# Patient Record
Sex: Female | Born: 1960 | Race: Black or African American | Hispanic: No | Smoking: Current every day smoker
Health system: Southern US, Community
[De-identification: ages and names within clinical notes are randomized; demographics above are authoritative.]

## PROBLEM LIST (undated history)

## (undated) DIAGNOSIS — E611 Iron deficiency: Secondary | ICD-10-CM

## (undated) DIAGNOSIS — E538 Deficiency of other specified B group vitamins: Secondary | ICD-10-CM

## (undated) DIAGNOSIS — F32A Depression, unspecified: Secondary | ICD-10-CM

## (undated) DIAGNOSIS — I10 Essential (primary) hypertension: Secondary | ICD-10-CM

## (undated) DIAGNOSIS — R7303 Prediabetes: Secondary | ICD-10-CM

## (undated) DIAGNOSIS — I480 Paroxysmal atrial fibrillation: Secondary | ICD-10-CM

## (undated) DIAGNOSIS — M1990 Unspecified osteoarthritis, unspecified site: Secondary | ICD-10-CM

## (undated) DIAGNOSIS — I5030 Unspecified diastolic (congestive) heart failure: Secondary | ICD-10-CM

## (undated) DIAGNOSIS — G4733 Obstructive sleep apnea (adult) (pediatric): Secondary | ICD-10-CM

## (undated) DIAGNOSIS — I4892 Unspecified atrial flutter: Secondary | ICD-10-CM

## (undated) DIAGNOSIS — J449 Chronic obstructive pulmonary disease, unspecified: Secondary | ICD-10-CM

## (undated) DIAGNOSIS — E039 Hypothyroidism, unspecified: Secondary | ICD-10-CM

## (undated) DIAGNOSIS — F329 Major depressive disorder, single episode, unspecified: Secondary | ICD-10-CM

## (undated) HISTORY — DX: Deficiency of other specified B group vitamins: E53.8

## (undated) HISTORY — DX: Hypothyroidism, unspecified: E03.9

## (undated) HISTORY — DX: Depression, unspecified: F32.A

## (undated) HISTORY — DX: Obstructive sleep apnea (adult) (pediatric): G47.33

## (undated) HISTORY — DX: Unspecified atrial flutter: I48.92

## (undated) HISTORY — DX: Iron deficiency: E61.1

---
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
## 2000-10-19 HISTORY — PX: BARIATRIC SURGERY: SHX1103

## 2012-10-24 DIAGNOSIS — R519 Headache, unspecified: Secondary | ICD-10-CM | POA: Insufficient documentation

## 2012-10-24 DIAGNOSIS — E039 Hypothyroidism, unspecified: Secondary | ICD-10-CM

## 2012-10-24 DIAGNOSIS — G8929 Other chronic pain: Secondary | ICD-10-CM | POA: Insufficient documentation

## 2012-10-24 DIAGNOSIS — M1990 Unspecified osteoarthritis, unspecified site: Secondary | ICD-10-CM | POA: Insufficient documentation

## 2012-10-24 DIAGNOSIS — Z6841 Body Mass Index (BMI) 40.0 and over, adult: Secondary | ICD-10-CM | POA: Insufficient documentation

## 2017-01-05 DIAGNOSIS — H04129 Dry eye syndrome of unspecified lacrimal gland: Secondary | ICD-10-CM | POA: Insufficient documentation

## 2017-01-05 DIAGNOSIS — H25813 Combined forms of age-related cataract, bilateral: Secondary | ICD-10-CM | POA: Insufficient documentation

## 2019-10-20 DIAGNOSIS — R7303 Prediabetes: Secondary | ICD-10-CM | POA: Insufficient documentation

## 2019-10-20 DIAGNOSIS — F419 Anxiety disorder, unspecified: Secondary | ICD-10-CM | POA: Insufficient documentation

## 2019-11-17 ENCOUNTER — Ambulatory Visit: Payer: Self-pay | Admitting: Cardiology

## 2019-11-27 ENCOUNTER — Ambulatory Visit (INDEPENDENT_AMBULATORY_CARE_PROVIDER_SITE_OTHER): Payer: Medicare Other

## 2019-11-27 ENCOUNTER — Ambulatory Visit: Payer: Medicare Other | Admitting: Cardiology

## 2019-11-27 ENCOUNTER — Other Ambulatory Visit: Payer: Self-pay

## 2019-11-27 ENCOUNTER — Encounter: Payer: Self-pay | Admitting: Cardiology

## 2019-11-27 VITALS — BP 119/86 | HR 91 | Ht 62.0 in | Wt >= 6400 oz

## 2019-11-27 DIAGNOSIS — Z8679 Personal history of other diseases of the circulatory system: Secondary | ICD-10-CM | POA: Diagnosis not present

## 2019-11-27 DIAGNOSIS — F17200 Nicotine dependence, unspecified, uncomplicated: Secondary | ICD-10-CM | POA: Diagnosis not present

## 2019-11-27 DIAGNOSIS — I10 Essential (primary) hypertension: Secondary | ICD-10-CM

## 2019-11-27 NOTE — Patient Instructions (Signed)
Medication Instructions:  Your physician recommends that you continue on your current medications as directed. Please refer to the Current Medication list given to you today.  *If you need a refill on your cardiac medications before your next appointment, please call your pharmacy*  Lab Work: None ordered If you have labs (blood work) drawn today and your tests are completely normal, you will receive your results only by: . MyChart Message (if you have MyChart) OR . A paper copy in the mail If you have any lab test that is abnormal or we need to change your treatment, we will call you to review the results.  Testing/Procedures: Your physician has recommended that you wear an zio monitor. Zio monitors are medical devices that record the heart's electrical activity. Doctors most often us these monitors to diagnose arrhythmias. Arrhythmias are problems with the speed or rhythm of the heartbeat. The monitor is a small, portable device. You can wear one while you do your normal daily activities. This is usually used to diagnose what is causing palpitations/syncope (passing out).    Follow-Up: At [facility], you and your health needs are our priority.  As part of our continuing mission to provide you with exceptional heart care, we have created designated Provider Care Teams.  These Care Teams include your primary Cardiologist (physician) and Advanced Practice Providers (APPs -  Physician Assistants and Nurse Practitioners) who all work together to provide you with the care you need, when you need it.  Your next appointment:   4 week(s)  The format for your next appointment:   In Person  Provider:    You may see Brian Agbor-Etang, MD or one of the following Advanced Practice Providers on your designated Care Team:    Christopher Berge, NP  Ryan Dunn, PA-C  Jacquelyn Visser, PA-C   Other Instructions Your physician has recommended that you wear a Zio monitor. This monitor is a  medical device that records the heart's electrical activity. Doctors most often use these monitors to diagnose arrhythmias. Arrhythmias are problems with the speed or rhythm of the heartbeat. The monitor is a small device applied to your chest. You can wear one while you do your normal daily activities. While wearing this monitor if you have any symptoms to push the button and record what you felt. Once you have worn this monitor for the period of time provider prescribed (Usually 14 days), you will return the monitor device in the postage paid box. Once it is returned they will download the data collected and provide us with a report which the provider will then review and we will call you with those results. Important tips:  1. Avoid showering during the first 24 hours of wearing the monitor. 2. Avoid excessive sweating to help maximize wear time. 3. Do not submerge the device, no hot tubs, and no swimming pools. 4. Keep any lotions or oils away from the patch. 5. After 24 hours you may shower with the patch on. Take brief showers with your back facing the shower head.  6. Do not remove patch once it has been placed because that will interrupt data and decrease adhesive wear time. 7. Push the button when you have any symptoms and write down what you were feeling. 8. Once you have completed wearing your monitor, remove and place into box which has postage paid and place in your outgoing mailbox.  9. If for some reason you have misplaced your box then call our office and we   can provide another box and/or mail it off for you.        

## 2019-11-27 NOTE — Progress Notes (Signed)
Cardiology Office Note:    Date:  11/27/2019   ID:  Ennis Forts, DOB 23-May-1961, MRN [id number]  PCP:  Franciso Bend, NP  Cardiologist:  Debbe Odea, MD  Electrophysiologist:  None   Referring MD: No ref. provider found   Chief Complaint  Patient presents with  . other    Est. Care pt wants to discuss blood thinner not taking one at this time.  Meds reviewed verbally with pt.    History of Present Illness:    Alexandra Black is a 59 y.o. female with a hx of atrial flutter, morbid obesity, OSA, current smoker x25+ years, hypertension who presents to establish care.  Patient used to live in [place] but recently moved to the area.  She was admitted to the hospital/[facility] in September/2020, diagnosed with hypoxic respiratory failure.  While hospitalized, she was noted to be in atrial flutter.  She was started on Cardizem and Xarelto 20 mg daily.  Echocardiogram obtained during hospitalization showed normal ejection fraction with EF 55 to 60%.  Study was difficult due to morbid obesity.  She states having appointments with pulmonology for sleep apnea and BiPAP mask fitting.  Past Medical History:  Diagnosis Date  . Atrial flutter (HCC)   . Depression   . Hypothyroidism   . Iron deficiency   . OSA (obstructive sleep apnea)   . Vitamin B12 deficiency     History reviewed. No pertinent surgical history.  Current Medications: Current Meds  Medication Sig  . Biotin 1000 MCG tablet Take 1,000 mcg by mouth daily.  . Cyanocobalamin (B-12) 5000 MCG CAPS Take by mouth daily.  Marland Kitchen diltiazem (CARDIZEM) 120 MG tablet Take 120 mg by mouth daily.  Marland Kitchen ELDERBERRY PO Take by mouth daily.  . furosemide (LASIX) 40 MG tablet Take 40 mg by mouth daily.  Marland Kitchen levothyroxine (SYNTHROID) 150 MCG tablet Take 150 mcg by mouth daily before breakfast.  . Multiple Vitamin (MULTIVITAMIN ADULT PO) Take by mouth daily.  . potassium chloride (MICRO-K) 10 MEQ CR capsule Take 10 mEq by mouth  daily.  Marland Kitchen venlafaxine XR (EFFEXOR-XR) 150 MG 24 hr capsule Take 150 mg by mouth daily with breakfast.  . VITAMIN D PO Take by mouth daily.  . vitamin E 180 MG (400 UNITS) capsule Take 400 Units by mouth daily.     Allergies:   Patient has no allergy information on record.   Social History   Socioeconomic History  . Marital status: Divorced    Spouse name: Not on file  . Number of children: Not on file  . Years of education: Not on file  . Highest education level: Not on file  Occupational History  . Not on file  Tobacco Use  . Smoking status: Never Smoker  . Smokeless tobacco: Never Used  Substance and Sexual Activity  . Alcohol use: Never  . Drug use: Never  . Sexual activity: Not on file  Other Topics Concern  . Not on file  Social History Narrative  . Not on file   Social Determinants of Health   Financial Resource Strain:   . Difficulty of Paying Living Expenses: Not on file  Food Insecurity:   . Worried About Programme researcher, broadcasting/film/video in the Last Year: Not on file  . Ran Out of Food in the Last Year: Not on file  Transportation Needs:   . Lack of Transportation (Medical): Not on file  . Lack of Transportation (Non-Medical): Not on file  Physical Activity:   .  Days of Exercise per Week: Not on file  . Minutes of Exercise per Session: Not on file  Stress:   . Feeling of Stress : Not on file  Social Connections:   . Frequency of Communication with Friends and Family: Not on file  . Frequency of Social Gatherings with Friends and Family: Not on file  . Attends Religious Services: Not on file  . Active Member of Clubs or Organizations: Not on file  . Attends Archivist Meetings: Not on file  . Marital Status: Not on file     Family History: The patient's family history includes Heart disease in her mother; Hypertension in her mother.  ROS:   Please see the history of present illness.     All other systems reviewed and are negative.  EKGs/Labs/Other  Studies Reviewed:    The following studies were reviewed today:   EKG:  EKG is  ordered today.  The ekg ordered today demonstrates normal sinus rhythm, possible left atrial enlargement, left anterior hemiblock.  Recent Labs: No results found for requested labs within last 8760 hours.  Recent Lipid Panel No results found for: CHOL, TRIG, HDL, CHOLHDL, VLDL, LDLCALC, LDLDIRECT  Physical Exam:    VS:  BP 119/86 (BP Location: Right Arm, Patient Position: Sitting, Cuff Size: Large)   Pulse 91   Ht 5\' 2"  (1.575 m)   Wt (!) 451 lb (204.6 kg)   SpO2 96%   BMI 82.49 kg/m     Wt Readings from Last 3 Encounters:  11/27/19 (!) 451 lb (204.6 kg)     GEN:  Well nourished, well developed in no acute distress, obese HEENT: Normal NECK: No JVD; No carotid bruits LYMPHATICS: No lymphadenopathy CARDIAC: RRR, no murmurs, rubs, gallops RESPIRATORY:  Clear to auscultation without rales, wheezing or rhonchi  ABDOMEN: Soft, non-tender, distended MUSCULOSKELETAL:  No edema; No deformity  SKIN: Warm and dry NEUROLOGIC:  Alert and oriented x 3 PSYCHIATRIC:  Normal affect   ASSESSMENT:    1. Hx of atrial flutter   2. Essential hypertension   3. Morbid obesity ([place])   4. Smoking    PLAN:    In order of problems listed above:  1. Patient with history of A. fib flutter.  Echocardiogram shows normal ejection fraction with EF 55 to 60%.  CHA2DS2-VASc score 2 (htn, gender).  Continue Cardizem CD 120 mg daily.  Patient advised to take Xarelto 20 mg daily.  Will place 2-week cardiac monitor to evaluate A. fib burden.  We will reach out to [facility] for rhythm strip or ECG diagnosing atrial fibrillation or flutter. 2. Blood pressure well controlled.  Continue Cardizem. 3. Patient with morbid obesity, BMI 82.  Weight loss/low calorie diet advised.  Over 10 minutes spent counseling patient.  Recommended patient sees a nutritionist for added support but she declines. 4. Long history of smoking.   Smoking cessation advised.  Over 5 minutes spent counseling patient.  Follow-up in 1 month after 2-week cardiac monitor.  Total encounter time more than 61 minutes  Greater than 50% was spent in counseling and coordination of care with the patient.  Including advising patient on smoking cessation, weight loss and calorie loss, chart review for new patient, review of records from prior facility, advised on current medical therapy.   This note was generated in part or whole with voice recognition software. Voice recognition is usually quite accurate but there are transcription errors that can and very often do occur. I apologize for  any typographical errors that were not detected and corrected.  Medication Adjustments/Labs and Tests Ordered: Current medicines are reviewed at length with the patient today.  Concerns regarding medicines are outlined above.  Orders Placed This Encounter  Procedures  . LONG TERM MONITOR (3-14 DAYS)  . EKG 12-Lead   No orders of the defined types were placed in this encounter.   Patient Instructions  Medication Instructions:  Your physician recommends that you continue on your current medications as directed. Please refer to the Current Medication list given to you today.  *If you need a refill on your cardiac medications before your next appointment, please call your pharmacy*  Lab Work: None ordered If you have labs (blood work) drawn today and your tests are completely normal, you will receive your results only by: Marland Kitchen MyChart Message (if you have MyChart) OR . A paper copy in the mail If you have any lab test that is abnormal or we need to change your treatment, we will call you to review the results.  Testing/Procedures: Your physician has recommended that you wear an zio monitor. Zio monitors are medical devices that record the heart's electrical activity. Doctors most often [place] these monitors to diagnose arrhythmias. Arrhythmias are problems with the  speed or rhythm of the heartbeat. The monitor is a small, portable device. You can wear one while you do your normal daily activities. This is usually used to diagnose what is causing palpitations/syncope (passing out).     Follow-Up: At [facility], you and your health needs are our priority.  As part of our continuing mission to provide you with exceptional heart care, we have created designated Provider Care Teams.  These Care Teams include your primary Cardiologist (physician) and Advanced Practice Providers (APPs -  Physician Assistants and Nurse Practitioners) who all work together to provide you with the care you need, when you need it.  Your next appointment:   4 week(s)  The format for your next appointment:   In Person  Provider:    You may see Debbe Odea, MD or one of the following Advanced Practice Providers on your designated Care Team:    Nicolasa Ducking, NP  Eula Listen, PA-C  Marisue Ivan, PA-C   Other Instructions Your physician has recommended that you wear a Zio monitor. This monitor is a medical device that records the heart's electrical activity. Doctors most often use these monitors to diagnose arrhythmias. Arrhythmias are problems with the speed or rhythm of the heartbeat. The monitor is a small device applied to your chest. You can wear one while you do your normal daily activities. While wearing this monitor if you have any symptoms to push the button and record what you felt. Once you have worn this monitor for the period of time provider prescribed (Usually 14 days), you will return the monitor device in the postage paid box. Once it is returned they will download the data collected and provide [place] with a report which the provider will then review and we will call you with those results. Important tips:  1. Avoid showering during the first 24 hours of wearing the monitor. 2. Avoid excessive sweating to help maximize wear time. 3. Do not submerge  the device, no hot tubs, and no swimming pools. 4. Keep any lotions or oils away from the patch. 5. After 24 hours you may shower with the patch on. Take brief showers with your back facing the shower head.  6. Do not remove patch once  it has been placed because that will interrupt data and decrease adhesive wear time. 7. Push the button when you have any symptoms and write down what you were feeling. 8. Once you have completed wearing your monitor, remove and place into box which has postage paid and place in your outgoing mailbox.  9. If for some reason you have misplaced your box then call our office and we can provide another box and/or mail it off for you.           Signed, Debbe Odea, MD  11/27/2019 4:45 PM    [facility]

## 2019-11-29 ENCOUNTER — Encounter: Payer: Self-pay | Admitting: Cardiology

## 2019-12-08 ENCOUNTER — Telehealth: Payer: Self-pay | Admitting: Cardiology

## 2019-12-08 NOTE — Telephone Encounter (Signed)
Returned the patients call. Alexandra Black. The patient is to contact [employer] customer service for assistance. The patient is to call our office if assistance is still needed.

## 2019-12-08 NOTE — Telephone Encounter (Signed)
Patient states he monitor has been flashing since she got out of the shower since Wednesday. States she feels " fine". Denies any symptoms. Please call to discuss.

## 2019-12-25 ENCOUNTER — Ambulatory Visit: Payer: Medicare Other | Admitting: Cardiology

## 2019-12-29 ENCOUNTER — Ambulatory Visit: Payer: Medicare Other | Admitting: Cardiology

## 2020-01-08 ENCOUNTER — Telehealth: Payer: Self-pay | Admitting: *Deleted

## 2020-01-08 NOTE — Telephone Encounter (Signed)
-----   Message from Debbe Odea, MD sent at 01/06/2020  9:47 AM EDT ----- Atrial fibrillation noted, about 2% burden.  Occasional sinus pauses noted between 7 AM and 7:35 AM.  Patient has history of obesity, sleep apnea likely contributing to sinus pauses.  Continue Xarelto and Cardizem as prescribed.  Keep follow-up appointments with pulmonary medicine with regards to CPAP mask.  Keep follow-up appointment with me.

## 2020-01-08 NOTE — Telephone Encounter (Signed)
Results called to pt. Pt verbalized understanding. She is taking Xarelto 20 mg daily originally prescribed by her cardiologist in [place]. Med list updated. She is currently working with her pulmonologist, Dr Meredeth Ide, and PCP to get her a new CPAP mask.  Appointment with Dr. Azucena Cecil scheduled as well.

## 2020-01-26 ENCOUNTER — Ambulatory Visit: Payer: Medicare Other | Admitting: Cardiology

## 2020-02-02 ENCOUNTER — Telehealth: Payer: Self-pay | Admitting: Obstetrics and Gynecology

## 2020-02-02 NOTE — Telephone Encounter (Signed)
I have called patient to schedule appointment, I was unable to leave a voice mail. I have scheduled pt for 4/21 @2 :30 with Dr. . Will continue to contact pt to make her aware.  Referral is attached for reference.

## 2020-02-07 ENCOUNTER — Encounter: Payer: Medicare Other | Admitting: Obstetrics and Gynecology

## 2020-02-08 ENCOUNTER — Other Ambulatory Visit: Payer: Self-pay | Admitting: Family Medicine

## 2020-02-08 DIAGNOSIS — Z1231 Encounter for screening mammogram for malignant neoplasm of breast: Secondary | ICD-10-CM

## 2020-02-28 ENCOUNTER — Other Ambulatory Visit: Payer: Self-pay

## 2020-02-28 ENCOUNTER — Ambulatory Visit (INDEPENDENT_AMBULATORY_CARE_PROVIDER_SITE_OTHER): Payer: Medicare Other | Admitting: Obstetrics and Gynecology

## 2020-02-28 ENCOUNTER — Encounter: Payer: Self-pay | Admitting: Obstetrics and Gynecology

## 2020-02-28 DIAGNOSIS — Z6841 Body Mass Index (BMI) 40.0 and over, adult: Secondary | ICD-10-CM

## 2020-02-28 DIAGNOSIS — R58 Hemorrhage, not elsewhere classified: Secondary | ICD-10-CM

## 2020-02-28 NOTE — Progress Notes (Signed)
HPI:      Alexandra Black is a 59 y.o. No obstetric history on file. who LMP was No LMP recorded.  Subjective:   She presents today giving history of postmenopausal bleeding.  She states that the bleeding only lasted 1 day and she was not sure if it came from her rectum or vagina or her bladder.  She has had no bleeding since that time.  Of significant note patient takes Xarelto.   She has been in menopause for greater than 8 years with no bleeding. She states that she had a D&C for "white spots" 2 years ago and this was performed by an oncologist.  She says she had follow-up over the phone but did not go back for her next appointment. She describes a past history of uterine fibroids but has not had any issues since menopause.    Hx: The following portions of the patient's history were reviewed and updated as appropriate:             She  has a past medical history of Atrial flutter (HCC), Depression, Hypothyroidism, Iron deficiency, OSA (obstructive sleep apnea), and Vitamin B12 deficiency. She does not have a problem list on file. She  has no past surgical history on file. Her family history includes Heart disease in her mother; Hypertension in her mother. She  reports that she has been smoking. She has never used smokeless tobacco. She reports current alcohol use of about 2.0 standard drinks of alcohol per week. She reports that she does not use drugs. She has a current medication list which includes the following prescription(s): allopurinol, biotin, b-12, diltiazem, elderberry, furosemide, levothyroxine, multiple vitamin, potassium chloride, rivaroxaban, venlafaxine xr, vitamin d, and vitamin e. She has No Known Allergies.       Review of Systems:  Review of Systems  Constitutional: Denied constitutional symptoms, night sweats, recent illness, fatigue, fever, insomnia and weight loss.  Eyes: Denied eye symptoms, eye pain, photophobia, vision change and visual disturbance.   Ears/Nose/Throat/Neck: Denied ear, nose, throat or neck symptoms, hearing loss, nasal discharge, sinus congestion and sore throat.  Cardiovascular: Denied cardiovascular symptoms, arrhythmia, chest pain/pressure, edema, exercise intolerance, orthopnea and palpitations.  Respiratory: Denied pulmonary symptoms, asthma, pleuritic pain, productive sputum, cough, dyspnea and wheezing.  Gastrointestinal: Denied, gastro-esophageal reflux, melena, nausea and vomiting.  Genitourinary: See HPI for additional information.  Musculoskeletal: Denied musculoskeletal symptoms, stiffness, swelling, muscle weakness and myalgia.  Dermatologic: Denied dermatology symptoms, rash and scar.  Neurologic: Denied neurology symptoms, dizziness, headache, neck pain and syncope.  Psychiatric: Denied psychiatric symptoms, anxiety and depression.  Endocrine: Denied endocrine symptoms including hot flashes and night sweats.   Meds:   Current Outpatient Medications on File Prior to Visit  Medication Sig Dispense Refill  . allopurinol (ZYLOPRIM) 100 MG tablet Take 100 mg by mouth daily.    . Biotin 1000 MCG tablet Take 1,000 mcg by mouth daily.    . Cyanocobalamin (B-12) 5000 MCG CAPS Take by mouth daily.    Marland Kitchen diltiazem (CARDIZEM) 120 MG tablet Take 120 mg by mouth daily.    Marland Kitchen ELDERBERRY PO Take by mouth daily.    . furosemide (LASIX) 40 MG tablet Take 40 mg by mouth daily.    Marland Kitchen levothyroxine (SYNTHROID) 150 MCG tablet Take 150 mcg by mouth daily before breakfast.    . Multiple Vitamin (MULTIVITAMIN ADULT PO) Take by mouth daily.    . potassium chloride (MICRO-K) 10 MEQ CR capsule Take 10 mEq by mouth daily.    Marland Kitchen  rivaroxaban (XARELTO) 20 MG TABS tablet Take 20 mg by mouth daily with supper.    . venlafaxine XR (EFFEXOR-XR) 150 MG 24 hr capsule Take 150 mg by mouth daily with breakfast.    . VITAMIN D PO Take by mouth daily.    . vitamin E 180 MG (400 UNITS) capsule Take 400 Units by mouth daily.     No current  facility-administered medications on file prior to visit.    Objective:     Vitals:   02/28/20 1452  BP: (!) 148/80  Pulse: 64                Assessment:    No obstetric history on file. There are no problems to display for this patient.    1. Morbid obesity with BMI of 70 and over, adult ([place])   2. Bleeding     Patient with "borderline diabetes, borderline hypertension, and borderline healthy/unhealthy".  Based on these risk factors and obesity she does have an increased risk for endometrial hyperplasia.  However her history of 1 day bleeding and no bleeding before or since is not worrisome.  This is especially true because the patient does not know if the blood actually came from the vagina.  In addition, her recent "D&C" would be an excellent source to understand if there was any evidence of hyperplasia at that time. Obviously her Xarelto could contribute to any type of bleeding.   Plan:            1.  Patient will obtain records from OB/GYN and oncology in [place] and once we have the records we can best determine our next step.  She is to contact us if she has further vaginal bleeding. Orders No orders of the defined types were placed in this encounter.   No orders of the defined types were placed in this encounter.     F/U  No follow-ups on file. I spent 31 minutes involved in the care of this patient preparing to see the patient by obtaining and reviewing her medical history (including labs, imaging tests and prior procedures), documenting clinical information in the electronic health record (EHR), counseling and coordinating care plans, writing and sending prescriptions, ordering tests or procedures and directly communicating with the patient by discussing pertinent items from her history and physical exam as well as detailing my assessment and plan as noted above so that she has an informed understanding.  All of her questions were answered.  Finis Bud,  M.D. 02/28/2020 3:24 PM

## 2020-05-11 ENCOUNTER — Other Ambulatory Visit: Payer: Self-pay

## 2020-05-11 ENCOUNTER — Inpatient Hospital Stay
Admission: EM | Admit: 2020-05-11 | Discharge: 2020-05-17 | DRG: 189 | Disposition: A | Discharge status: Skilled Nursing Facility | Payer: Medicare Other | Attending: Internal Medicine | Admitting: Internal Medicine

## 2020-05-11 ENCOUNTER — Emergency Department: Payer: Medicare Other

## 2020-05-11 ENCOUNTER — Encounter: Payer: Self-pay | Admitting: Emergency Medicine

## 2020-05-11 DIAGNOSIS — J9622 Acute and chronic respiratory failure with hypercapnia: Secondary | ICD-10-CM | POA: Diagnosis present

## 2020-05-11 DIAGNOSIS — J9621 Acute and chronic respiratory failure with hypoxia: Principal | ICD-10-CM | POA: Diagnosis present

## 2020-05-11 DIAGNOSIS — E0500 Thyrotoxicosis with diffuse goiter without thyrotoxic crisis or storm: Secondary | ICD-10-CM | POA: Diagnosis present

## 2020-05-11 DIAGNOSIS — R0902 Hypoxemia: Secondary | ICD-10-CM | POA: Diagnosis not present

## 2020-05-11 DIAGNOSIS — R1310 Dysphagia, unspecified: Secondary | ICD-10-CM | POA: Diagnosis present

## 2020-05-11 DIAGNOSIS — Z9884 Bariatric surgery status: Secondary | ICD-10-CM

## 2020-05-11 DIAGNOSIS — Z20822 Contact with and (suspected) exposure to covid-19: Secondary | ICD-10-CM | POA: Diagnosis present

## 2020-05-11 DIAGNOSIS — Z79899 Other long term (current) drug therapy: Secondary | ICD-10-CM | POA: Diagnosis not present

## 2020-05-11 DIAGNOSIS — G4733 Obstructive sleep apnea (adult) (pediatric): Secondary | ICD-10-CM

## 2020-05-11 DIAGNOSIS — R0602 Shortness of breath: Secondary | ICD-10-CM | POA: Diagnosis not present

## 2020-05-11 DIAGNOSIS — E119 Type 2 diabetes mellitus without complications: Secondary | ICD-10-CM

## 2020-05-11 DIAGNOSIS — I480 Paroxysmal atrial fibrillation: Secondary | ICD-10-CM | POA: Diagnosis present

## 2020-05-11 DIAGNOSIS — J9601 Acute respiratory failure with hypoxia: Secondary | ICD-10-CM | POA: Diagnosis present

## 2020-05-11 DIAGNOSIS — J449 Chronic obstructive pulmonary disease, unspecified: Secondary | ICD-10-CM | POA: Diagnosis not present

## 2020-05-11 DIAGNOSIS — K219 Gastro-esophageal reflux disease without esophagitis: Secondary | ICD-10-CM | POA: Diagnosis present

## 2020-05-11 DIAGNOSIS — Z79890 Hormone replacement therapy (postmenopausal): Secondary | ICD-10-CM | POA: Diagnosis not present

## 2020-05-11 DIAGNOSIS — Z8249 Family history of ischemic heart disease and other diseases of the circulatory system: Secondary | ICD-10-CM | POA: Diagnosis not present

## 2020-05-11 DIAGNOSIS — J810 Acute pulmonary edema: Secondary | ICD-10-CM | POA: Diagnosis present

## 2020-05-11 DIAGNOSIS — E039 Hypothyroidism, unspecified: Secondary | ICD-10-CM | POA: Diagnosis present

## 2020-05-11 DIAGNOSIS — F329 Major depressive disorder, single episode, unspecified: Secondary | ICD-10-CM | POA: Diagnosis present

## 2020-05-11 DIAGNOSIS — F17210 Nicotine dependence, cigarettes, uncomplicated: Secondary | ICD-10-CM | POA: Diagnosis present

## 2020-05-11 DIAGNOSIS — Z9114 Patient's other noncompliance with medication regimen: Secondary | ICD-10-CM

## 2020-05-11 DIAGNOSIS — I5033 Acute on chronic diastolic (congestive) heart failure: Secondary | ICD-10-CM | POA: Diagnosis present

## 2020-05-11 DIAGNOSIS — Z9119 Patient's noncompliance with other medical treatment and regimen: Secondary | ICD-10-CM | POA: Diagnosis not present

## 2020-05-11 DIAGNOSIS — M351 Other overlap syndromes: Secondary | ICD-10-CM | POA: Diagnosis present

## 2020-05-11 DIAGNOSIS — Z6841 Body Mass Index (BMI) 40.0 and over, adult: Secondary | ICD-10-CM

## 2020-05-11 DIAGNOSIS — G9349 Other encephalopathy: Secondary | ICD-10-CM | POA: Diagnosis present

## 2020-05-11 DIAGNOSIS — E662 Morbid (severe) obesity with alveolar hypoventilation: Secondary | ICD-10-CM | POA: Diagnosis present

## 2020-05-11 DIAGNOSIS — J9600 Acute respiratory failure, unspecified whether with hypoxia or hypercapnia: Secondary | ICD-10-CM

## 2020-05-11 DIAGNOSIS — R042 Hemoptysis: Secondary | ICD-10-CM | POA: Diagnosis present

## 2020-05-11 DIAGNOSIS — Z818 Family history of other mental and behavioral disorders: Secondary | ICD-10-CM

## 2020-05-11 DIAGNOSIS — F32A Depression, unspecified: Secondary | ICD-10-CM

## 2020-05-11 DIAGNOSIS — Z7901 Long term (current) use of anticoagulants: Secondary | ICD-10-CM | POA: Diagnosis not present

## 2020-05-11 DIAGNOSIS — J441 Chronic obstructive pulmonary disease with (acute) exacerbation: Secondary | ICD-10-CM | POA: Diagnosis present

## 2020-05-11 DIAGNOSIS — I4892 Unspecified atrial flutter: Secondary | ICD-10-CM | POA: Diagnosis present

## 2020-05-11 HISTORY — DX: Unspecified diastolic (congestive) heart failure: I50.30

## 2020-05-11 HISTORY — DX: Paroxysmal atrial fibrillation: I48.0

## 2020-05-11 HISTORY — DX: Morbid (severe) obesity due to excess calories: E66.01

## 2020-05-11 HISTORY — DX: Unspecified osteoarthritis, unspecified site: M19.90

## 2020-05-11 LAB — URINALYSIS, ROUTINE W REFLEX MICROSCOPIC
Bilirubin Urine: NEGATIVE
Glucose, UA: NEGATIVE mg/dL
Ketones, ur: NEGATIVE mg/dL
Nitrite: NEGATIVE
Protein, ur: NEGATIVE mg/dL
Specific Gravity, Urine: 1.006 (ref 1.005–1.030)
pH: 5 (ref 5.0–8.0)

## 2020-05-11 LAB — COMPREHENSIVE METABOLIC PANEL
ALT: 49 U/L — ABNORMAL HIGH (ref 0–44)
AST: 56 U/L — ABNORMAL HIGH (ref 15–41)
Albumin: 3.9 g/dL (ref 3.5–5.0)
Alkaline Phosphatase: 110 U/L (ref 38–126)
Anion gap: 10 (ref 5–15)
BUN: 17 mg/dL (ref 6–20)
CO2: 28 mmol/L (ref 22–32)
Calcium: 9.1 mg/dL (ref 8.9–10.3)
Chloride: 103 mmol/L (ref 98–111)
Creatinine, Ser: 0.96 mg/dL (ref 0.44–1.00)
GFR calc Af Amer: >60 mL/min (ref >60)
GFR calc non Af Amer: >60 mL/min (ref >60)
Glucose, Bld: 172 mg/dL — ABNORMAL HIGH (ref 70–99)
Potassium: 4.9 mmol/L (ref 3.5–5.1)
Sodium: 141 mmol/L (ref 135–145)
Total Bilirubin: 1.1 mg/dL (ref 0.3–1.2)
Total Protein: 7.9 g/dL (ref 6.5–8.1)

## 2020-05-11 LAB — BRAIN NATRIURETIC PEPTIDE: B Natriuretic Peptide: 129.3 pg/mL — ABNORMAL HIGH (ref 0.0–100.0)

## 2020-05-11 LAB — TROPONIN I (HIGH SENSITIVITY)
Troponin I (High Sensitivity): 11 ng/L (ref <18)
Troponin I (High Sensitivity): 11 ng/L (ref <18)

## 2020-05-11 LAB — CBC
HCT: 43.4 % (ref 36.0–46.0)
Hemoglobin: 12.9 g/dL (ref 12.0–15.0)
MCH: 23.3 pg — ABNORMAL LOW (ref 26.0–34.0)
MCHC: 29.7 g/dL — ABNORMAL LOW (ref 30.0–36.0)
MCV: 78.5 fL — ABNORMAL LOW (ref 80.0–100.0)
Platelets: 283 10*3/uL (ref 150–400)
RBC: 5.53 MIL/uL — ABNORMAL HIGH (ref 3.87–5.11)
RDW: 17 % — ABNORMAL HIGH (ref 11.5–15.5)
WBC: 10.4 10*3/uL (ref 4.0–10.5)
nRBC: 0.4 % — ABNORMAL HIGH (ref 0.0–0.2)

## 2020-05-11 LAB — BLOOD GAS, ARTERIAL
Acid-Base Excess: 5.5 mmol/L — ABNORMAL HIGH (ref 0.0–2.0)
Acid-Base Excess: 7 mmol/L — ABNORMAL HIGH (ref 0.0–2.0)
Allens test (pass/fail): POSITIVE — AB
Bicarbonate: 35.5 mmol/L — ABNORMAL HIGH (ref 20.0–28.0)
Bicarbonate: 36.7 mmol/L — ABNORMAL HIGH (ref 20.0–28.0)
Delivery systems: POSITIVE
Delivery systems: POSITIVE
Expiratory PAP: 10
Expiratory PAP: 10
FIO2: 0.4
FIO2: 40
Inspiratory PAP: 20
Inspiratory PAP: 24
O2 Saturation: 92.3 %
O2 Saturation: 94.3 %
Patient temperature: 37
pCO2 arterial: 81 mmHg (ref 32.0–48.0)
pCO2 arterial: 78 mmHg (ref 32.0–48.0)
pH, Arterial: 7.25 — ABNORMAL LOW (ref 7.350–7.450)
pH, Arterial: 7.28 — ABNORMAL LOW (ref 7.350–7.450)
pO2, Arterial: 75 mmHg — ABNORMAL LOW (ref 83.0–108.0)
pO2, Arterial: 81 mmHg — ABNORMAL LOW (ref 83.0–108.0)

## 2020-05-11 LAB — T4, FREE: Free T4: 0.91 ng/dL (ref 0.61–1.12)

## 2020-05-11 LAB — GLUCOSE, CAPILLARY: Glucose-Capillary: 128 mg/dL — ABNORMAL HIGH (ref 70–99)

## 2020-05-11 LAB — TSH: TSH: 3.343 u[IU]/mL (ref 0.350–4.500)

## 2020-05-11 LAB — MRSA PCR SCREENING: MRSA by PCR: NEGATIVE

## 2020-05-11 LAB — SARS CORONAVIRUS 2 BY RT PCR (HOSPITAL ORDER, PERFORMED IN ~~LOC~~ HOSPITAL LAB): SARS Coronavirus 2: NEGATIVE

## 2020-05-11 MED ORDER — INSULIN ASPART 100 UNIT/ML ~~LOC~~ SOLN
0.0000 [IU] | Freq: Three times a day (TID) | SUBCUTANEOUS | Status: DC
  Administered 2020-05-12 – 2020-05-13 (×5): 3 [IU] via SUBCUTANEOUS
  Administered 2020-05-13: 13:00:00 5 [IU] via SUBCUTANEOUS
  Administered 2020-05-14 – 2020-05-15 (×4): 3 [IU] via SUBCUTANEOUS
  Administered 2020-05-15: 8 [IU] via SUBCUTANEOUS
  Administered 2020-05-15: 19:00:00 3 [IU] via SUBCUTANEOUS
  Administered 2020-05-16 (×2): 5 [IU] via SUBCUTANEOUS
  Administered 2020-05-16 – 2020-05-17 (×3): 3 [IU] via SUBCUTANEOUS
  Filled 2020-05-11 (×18): qty 1

## 2020-05-11 MED ORDER — SORBITOL 70 % SOLN
30.0000 mL | Freq: Every day | Status: DC | PRN
  Filled 2020-05-11: qty 30

## 2020-05-11 MED ORDER — DILTIAZEM HCL 30 MG PO TABS
120.0000 mg | ORAL_TABLET | Freq: Every day | ORAL | Status: DC
  Administered 2020-05-12 – 2020-05-17 (×6): 120 mg via ORAL
  Filled 2020-05-11 (×5): qty 4
  Filled 2020-05-11: qty 2

## 2020-05-11 MED ORDER — CHLORHEXIDINE GLUCONATE 0.12 % MT SOLN
15.0000 mL | Freq: Two times a day (BID) | OROMUCOSAL | Status: DC
  Administered 2020-05-11: 15 mL via OROMUCOSAL
  Filled 2020-05-11: qty 15

## 2020-05-11 MED ORDER — LEVOTHYROXINE SODIUM 50 MCG PO TABS
150.0000 ug | ORAL_TABLET | Freq: Every day | ORAL | Status: DC
  Administered 2020-05-12 – 2020-05-17 (×6): 150 ug via ORAL
  Filled 2020-05-11 (×7): qty 1

## 2020-05-11 MED ORDER — ACETAMINOPHEN 650 MG RE SUPP: 650.0000 mg | Freq: Four times a day (QID) | RECTAL | Status: DC | PRN

## 2020-05-11 MED ORDER — IPRATROPIUM-ALBUTEROL 0.5-2.5 (3) MG/3ML IN SOLN
3.0000 mL | Freq: Four times a day (QID) | RESPIRATORY_TRACT | Status: DC
  Administered 2020-05-11 – 2020-05-17 (×24): 3 mL via RESPIRATORY_TRACT
  Filled 2020-05-11 (×24): qty 3

## 2020-05-11 MED ORDER — SODIUM CHLORIDE 0.9% FLUSH
3.0000 mL | Freq: Two times a day (BID) | INTRAVENOUS | Status: DC
  Administered 2020-05-11 – 2020-05-17 (×12): 3 mL via INTRAVENOUS

## 2020-05-11 MED ORDER — METHYLPREDNISOLONE SODIUM SUCC 40 MG IJ SOLR
40.0000 mg | Freq: Four times a day (QID) | INTRAMUSCULAR | Status: DC
  Administered 2020-05-11 – 2020-05-13 (×8): 40 mg via INTRAVENOUS
  Filled 2020-05-11 (×8): qty 1

## 2020-05-11 MED ORDER — ACETAMINOPHEN 325 MG PO TABS
650.0000 mg | ORAL_TABLET | Freq: Four times a day (QID) | ORAL | Status: DC | PRN
  Administered 2020-05-12 – 2020-05-15 (×4): 650 mg via ORAL
  Filled 2020-05-11 (×4): qty 2

## 2020-05-11 MED ORDER — SODIUM CHLORIDE 0.9% FLUSH: 3.0000 mL | INTRAVENOUS | Status: DC | PRN

## 2020-05-11 MED ORDER — RIVAROXABAN 20 MG PO TABS
20.0000 mg | ORAL_TABLET | Freq: Every day | ORAL | Status: DC
  Administered 2020-05-12 – 2020-05-17 (×5): 20 mg via ORAL
  Filled 2020-05-11 (×7): qty 1

## 2020-05-11 MED ORDER — SODIUM CHLORIDE 0.9 % IV SOLN
500.0000 mg | INTRAVENOUS | Status: AC
  Administered 2020-05-11 – 2020-05-13 (×3): 500 mg via INTRAVENOUS
  Filled 2020-05-11 (×3): qty 500

## 2020-05-11 MED ORDER — POLYETHYLENE GLYCOL 3350 17 G PO PACK: 17.0000 g | PACK | Freq: Every day | ORAL | Status: DC | PRN

## 2020-05-11 MED ORDER — ORAL CARE MOUTH RINSE: 15.0000 mL | Freq: Two times a day (BID) | OROMUCOSAL | Status: DC

## 2020-05-11 MED ORDER — VENLAFAXINE HCL ER 75 MG PO CP24
150.0000 mg | ORAL_CAPSULE | Freq: Every day | ORAL | Status: DC
  Administered 2020-05-12 – 2020-05-17 (×6): 150 mg via ORAL
  Filled 2020-05-11 (×5): qty 2
  Filled 2020-05-11: qty 1
  Filled 2020-05-11: qty 2

## 2020-05-11 MED ORDER — SODIUM CHLORIDE 0.9 % IV SOLN: 250.0000 mL | INTRAVENOUS | Status: DC | PRN

## 2020-05-11 MED ORDER — CHLORHEXIDINE GLUCONATE CLOTH 2 % EX PADS
6.0000 | MEDICATED_PAD | Freq: Every day | CUTANEOUS | Status: DC
  Administered 2020-05-11 – 2020-05-16 (×5): 6 via TOPICAL

## 2020-05-11 MED ORDER — GUAIFENESIN ER 600 MG PO TB12
600.0000 mg | ORAL_TABLET | Freq: Two times a day (BID) | ORAL | Status: DC | PRN
  Administered 2020-05-12 – 2020-05-16 (×2): 600 mg via ORAL
  Filled 2020-05-11 (×4): qty 1

## 2020-05-11 MED ORDER — FUROSEMIDE 10 MG/ML IJ SOLN
80.0000 mg | Freq: Two times a day (BID) | INTRAMUSCULAR | Status: AC
  Administered 2020-05-11 – 2020-05-12 (×3): 80 mg via INTRAVENOUS
  Filled 2020-05-11 (×3): qty 8

## 2020-05-11 MED ORDER — POTASSIUM CHLORIDE CRYS ER 10 MEQ PO TBCR
10.0000 meq | EXTENDED_RELEASE_TABLET | Freq: Every day | ORAL | Status: DC
  Administered 2020-05-12 – 2020-05-17 (×6): 10 meq via ORAL
  Filled 2020-05-11 (×6): qty 1

## 2020-05-11 MED ORDER — FUROSEMIDE 10 MG/ML IJ SOLN
60.0000 mg | Freq: Once | INTRAMUSCULAR | Status: AC
  Administered 2020-05-11: 60 mg via INTRAVENOUS
  Filled 2020-05-11: qty 8

## 2020-05-11 NOTE — Consult Note (Signed)
CRITICAL CARE PROGRESS NOTE    Name: Ennis FortsSheneta Peacock MRN: [id number] DOB: 02-Oct-1961     LOS: 0 Referring physician: Dr Reine Justhatarjee.   SUBJECTIVE FINDINGS & SIGNIFICANT EVENTS    Patient description:  79F with hx of BMI>80, COPD, OSA, DM2,who came with her sister for SOB 3d. She was also found to be mildy lethargic. She reported an episode of non-massive hemoptysis which resolved on its own, she is on xarelto. 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    Daughter is present Barrett Henle(ONyinyechi) she states patient has recurrent episodes of hypercapnic encephalopathy. Patient has not been able to use her home bipap due to problems with interface.   Lines/tubes : External Urinary Catheter (Active)  Collection Container Standard drainage bag 05/11/20 1111  Securement Method Tape 05/11/20 1111  Site Assessment Clean;Intact;Dry 05/11/20 1111    Microbiology/Sepsis markers: Results for orders placed or performed during the hospital encounter of 05/11/20  SARS Coronavirus 2 by RT PCR (hospital order, performed in [facility] hospital lab) Nasopharyngeal Nasopharyngeal Swab     Status: None   Collection Time: 05/11/20  8:52 AM   Specimen: Nasopharyngeal Swab  Result Value Ref Range Status   SARS Coronavirus 2 NEGATIVE NEGATIVE Final    Comment: (NOTE) SARS-CoV-2 target nucleic acids are NOT DETECTED.  The SARS-CoV-2 RNA is generally detectable in upper and lower respiratory specimens during the acute phase of infection. The lowest concentration of SARS-CoV-2 viral copies this assay can detect is 250 copies / mL. A negative result does not preclude SARS-CoV-2 infection and should not be used as the sole basis for treatment or other patient management decisions.  A  negative result may occur with improper specimen collection / handling, submission of specimen other than nasopharyngeal swab, presence of viral mutation(s) within the areas targeted by this assay, and inadequate number of viral copies (<250 copies / mL). A negative result must be combined with clinical observations, patient history, and epidemiological information.  Fact Sheet for Patients:   BoilerBrush.com.cyhttps://www.fda.gov/media/136312/download  Fact Sheet for Healthcare Providers: https://pope.com/https://www.fda.gov/media/136313/download  This test is not yet approved or  cleared by the [place] FDA and has been authorized for detection and/or diagnosis of SARS-CoV-2 by FDA under an Emergency Use Authorization (EUA).  This EUA will remain in effect (meaning this test can be used) for the duration of the COVID-19 declaration under Section 564(b)(1) of the Act, 21 U.S.C. section 360bbb-3(b)(1), unless the authorization is terminated or revoked sooner.  Performed at [facility], [address] [place], [place] [address]     Anti-infectives:  Anti-infectives (From admission, onward)   None       Consults:      Tests / Events: TTE ABG    PAST MEDICAL HISTORY   Past Medical History:  Diagnosis Date  . Atrial flutter (HCC)   . Depression   . Hypothyroidism   . Iron deficiency   . OSA (obstructive sleep apnea)   . Vitamin B12 deficiency      SURGICAL HISTORY   History reviewed. No pertinent surgical history.   FAMILY HISTORY   Family History  Problem Relation Age of Onset  . Heart disease Mother   . Hypertension Mother      SOCIAL HISTORY   Social History   Tobacco Use  . Smoking status: Current Every Day Smoker  . Smokeless tobacco: Never Used  Substance Use Topics  . Alcohol use: Yes  Alcohol/week: 2.0 standard drinks    Types: 2 Glasses of wine per week  . Drug use: Never     MEDICATIONS   Current Medication:  Current  Facility-Administered Medications:  .  0.9 %  sodium chloride infusion, 250 mL, Intravenous, PRN, Luberta Robertson, Raynaldo Opitz, MD .  acetaminophen (TYLENOL) tablet 650 mg, 650 mg, Oral, Q6H PRN **OR** acetaminophen (TYLENOL) suppository 650 mg, 650 mg, Rectal, Q6H PRN, Luberta Robertson, Horatio Pel Tublu, MD .  furosemide (LASIX) injection 80 mg, 80 mg, Intravenous, Q12H, Chatterjee, Srobona Tublu, MD .  insulin aspart (novoLOG) injection 0-15 Units, 0-15 Units, Subcutaneous, TID WC, Chatterjee, Srobona Tublu, MD .  ipratropium-albuterol (DUONEB) 0.5-2.5 (3) MG/3ML nebulizer solution 3 mL, 3 mL, Nebulization, Q6H, Leandro Reasoner Tublu, MD, 3 mL at 05/11/20 1249 .  polyethylene glycol (MIRALAX / GLYCOLAX) packet 17 g, 17 g, Oral, Daily PRN, Luberta Robertson, Horatio Pel Tublu, MD .  sodium chloride flush (NS) 0.9 % injection 3 mL, 3 mL, Intravenous, Q12H, Chatterjee, Horatio Pel Tublu, MD .  sodium chloride flush (NS) 0.9 % injection 3 mL, 3 mL, Intravenous, PRN, Luberta Robertson, Horatio Pel Tublu, MD .  sorbitol 70 % solution 30 mL, 30 mL, Oral, Daily PRN, Luberta Robertson, Raynaldo Opitz, MD  Current Outpatient Medications:  .  albuterol (VENTOLIN HFA) 108 (90 Base) MCG/ACT inhaler, Inhale into the lungs every 6 (six) hours as needed for wheezing or shortness of breath., Disp: , Rfl:  .  allopurinol (ZYLOPRIM) 100 MG tablet, Take 100 mg by mouth daily., Disp: , Rfl:  .  b complex vitamins tablet, Take 1 tablet by mouth daily., Disp: , Rfl:  .  Cholecalciferol 25 MCG (1000 UT) capsule, Take 1,000 Units by mouth daily., Disp: , Rfl:  .  diltiazem (CARDIZEM) 120 MG tablet, Take 120 mg by mouth daily., Disp: , Rfl:  .  Docosahexaenoic Acid 100 MG CAPS, Take by mouth., Disp: , Rfl:  .  ELDERBERRY PO, Take by mouth daily., Disp: , Rfl:  .  furosemide (LASIX) 40 MG tablet, Take 40 mg by mouth daily., Disp: , Rfl:  .  guaiFENesin (MUCINEX) 600 MG 12 hr tablet, Take 600-1,200 mg by mouth 2 (two) times daily as needed., Disp: , Rfl:  .   ipratropium-albuterol (DUONEB) 0.5-2.5 (3) MG/3ML SOLN, Take 3 mLs by nebulization., Disp: , Rfl:  .  levothyroxine (SYNTHROID) 150 MCG tablet, Take 150 mcg by mouth daily before breakfast., Disp: , Rfl:  .  metFORMIN (GLUCOPHAGE) 500 MG tablet, Take 500 mg by mouth 2 (two) times daily with a meal., Disp: , Rfl:  .  Multiple Vitamin (MULTIVITAMIN ADULT PO), Take by mouth daily., Disp: , Rfl:  .  potassium chloride (MICRO-K) 10 MEQ CR capsule, Take 10 mEq by mouth daily., Disp: , Rfl:  .  rivaroxaban (XARELTO) 20 MG TABS tablet, Take 20 mg by mouth daily with supper., Disp: , Rfl:  .  traMADol (ULTRAM) 50 MG tablet, Take by mouth every 6 (six) hours as needed., Disp: , Rfl:  .  triamterene-hydrochlorothiazide (DYAZIDE) 37.5-25 MG capsule, Take 1 capsule by mouth daily., Disp: , Rfl:  .  venlafaxine XR (EFFEXOR-XR) 150 MG 24 hr capsule, Take 150 mg by mouth daily with breakfast., Disp: , Rfl:  .  Biotin 1000 MCG tablet, Take 1,000 mcg by mouth daily., Disp: , Rfl:  .  Cyanocobalamin (B-12) 5000 MCG CAPS, Take by mouth daily., Disp: , Rfl:  .  Fluticasone-Salmeterol (ADVAIR) 100-50 MCG/DOSE AEPB, Inhale 1 puff into the lungs 2 (two) times daily., Disp: ,  Rfl:     ALLERGIES   Patient has no known allergies.    REVIEW OF SYSTEMS    10 point ROS not possible due to encephalopathy  PHYSICAL EXAMINATION   Vital Signs: Temp:  [98 F (36.7 C)] 98 F (36.7 C) (07/24 0814) Pulse Rate:  [51-91] 75 (07/24 1230) Resp:  [20-40] 20 (07/24 1230) BP: (122-171)/(66-95) 141/91 (07/24 1230) SpO2:  [82 %-95 %] 91 % (07/24 1230) Weight:  [206.8 kg] 206.8 kg (07/24 0813)  GENERAL:older then stated age HEAD: Normocephalic, atraumatic.  EYES: Pupils equal, round, reactive to light.  No scleral icterus.  MOUTH: Moist mucosal membrane. NECK: Supple. No thyromegaly. No nodules. No JVD.  PULMONARY: decreased bs bilaterally  CARDIOVASCULAR: S1 and S2. Regular rate and rhythm. No murmurs, rubs, or  gallops.  GASTROINTESTINAL: Soft, nontender, non-distended. No masses. Positive bowel sounds. No hepatosplenomegaly.  MUSCULOSKELETAL: No swelling, clubbing, or edema.  NEUROLOGIC: Mild distress due to acute illness SKIN:intact,warm,dry   PERTINENT DATA     Infusions: . sodium chloride     Scheduled Medications: . furosemide  80 mg Intravenous Q12H  . insulin aspart  0-15 Units Subcutaneous TID WC  . ipratropium-albuterol  3 mL Nebulization Q6H  . sodium chloride flush  3 mL Intravenous Q12H   PRN Medications: sodium chloride, acetaminophen **OR** acetaminophen, polyethylene glycol, sodium chloride flush, sorbitol Hemodynamic parameters:   Intake/Output: No intake/output data recorded.  Ventilator  Settings:     LAB RESULTS:  Basic Metabolic Panel: Recent Labs  Lab 05/11/20 0823  NA 141  K 4.9  CL 103  CO2 28  GLUCOSE 172*  BUN 17  CREATININE 0.96  CALCIUM 9.1   Liver Function Tests: Recent Labs  Lab 05/11/20 0823  AST 56*  ALT 49*  ALKPHOS 110  BILITOT 1.1  PROT 7.9  ALBUMIN 3.9   No results for input(s): LIPASE, AMYLASE in the last 168 hours. No results for input(s): AMMONIA in the last 168 hours. CBC: Recent Labs  Lab 05/11/20 0823  WBC 10.4  HGB 12.9  HCT 43.4  MCV 78.5*  PLT 283   Cardiac Enzymes: No results for input(s): CKTOTAL, CKMB, CKMBINDEX, TROPONINI in the last 168 hours. BNP: Invalid input(s): POCBNP CBG: No results for input(s): GLUCAP in the last 168 hours.     IMAGING RESULTS:  Imaging: DG Chest Portable 1 View  Result Date: 05/11/2020 CLINICAL DATA:  Shortness of breath beginning last night. EXAM: PORTABLE CHEST 1 VIEW COMPARISON:  None. FINDINGS: Lordotic technique is demonstrated. Lungs are adequately inflated demonstrate hazy prominence of the perihilar markings suggesting a degree of vascular congestion. No evidence of effusion. Minimal linear density over the lateral left midlung likely atelectasis.  Cardiomegaly. Prominent overlying soft tissues. IMPRESSION: Mild cardiomegaly and findings suggesting mild interstitial edema. Minimal linear atelectasis left midlung. Electronically Signed   By: Elberta Fortis M.D.   On: 05/11/2020 09:03   @PROBHOSP @ DG Chest Portable 1 View  Result Date: 05/11/2020 CLINICAL DATA:  Shortness of breath beginning last night. EXAM: PORTABLE CHEST 1 VIEW COMPARISON:  None. FINDINGS: Lordotic technique is demonstrated. Lungs are adequately inflated demonstrate hazy prominence of the perihilar markings suggesting a degree of vascular congestion. No evidence of effusion. Minimal linear density over the lateral left midlung likely atelectasis. Cardiomegaly. Prominent overlying soft tissues. IMPRESSION: Mild cardiomegaly and findings suggesting mild interstitial edema. Minimal linear atelectasis left midlung. Electronically Signed   By: 05/13/2020 M.D.   On: 05/11/2020 09:03     ASSESSMENT AND  PLAN    -Multidisciplinary rounds held today  Altered mental status with lethargy   - patient is s/p ABG with hypecania and acedemia consistent with hypercapnic encephalopathy  - adjusted BIPAP with increased driving pressure  -patient takes off mask multiple times during my exam.   COPD/OSA/OHS overlap syndrome  - continue BIPAP 24/10    Graves Disease  - continue synthroid as per home regimen   Paroxysmal AF -Wandering atrial pacemaker during my evaluation this evening -on xarelto at home   Acute on chronic diastolic CHF  patient takes lasix at home , has 2+LE non pitting edema which may be realted to underlying Graves disease  -oxygen as needed -Lasix as tolerated ICU telemetry monitoring  ID -continue IV abx as prescibed -follow up cultures  GI/Nutrition GI PROPHYLAXIS as indicated DIET-->TF's as tolerated Constipation protocol as indicated  ENDO - ICU hypoglycemic\Hyperglycemia protocol -check FSBS per protocol   ELECTROLYTES -follow labs as  needed -replace as needed -pharmacy consultation   DVT/GI PRX ordered -SCDs  TRANSFUSIONS AS NEEDED MONITOR FSBS ASSESS the need for LABS as needed   Critical care provider statement:    Critical care time (minutes):  33   Critical care time was exclusive of:  Separately billable procedures and treating other patients   Critical care was necessary to treat or prevent imminent or life-threatening deterioration of the following conditions:  AMS with encephalopathy , COPD, OHS   Critical care was time spent personally by me on the following activities:  Development of treatment plan with patient or surrogate, discussions with consultants, evaluation of patient's response to treatment, examination of patient, obtaining history from patient or surrogate, ordering and performing treatments and interventions, ordering and review of laboratory studies and re-evaluation of patient's condition.  I assumed direction of critical care for this patient from another provider in my specialty: no    This document was prepared using Dragon voice recognition software and may include unintentional dictation errors.    Vida Rigger, M.D.  Division of Pulmonary & Critical Care Medicine  [name]

## 2020-05-11 NOTE — ED Notes (Signed)
Pt saturated with urine at this time. Full linen change pt clean and dry at this time. New purewick placed on pt.

## 2020-05-11 NOTE — ED Notes (Signed)
resp rate of bipap increased to 24 resp a min PH 7.25 PCO2 81 PO2 75  Dr Lenard Lance aware

## 2020-05-11 NOTE — ED Triage Notes (Signed)
Pt presents to ED via POV with c/o increasing respiratory distress at this time that started last night. Upon arrival to ED with noted RR of 40 and RA O2 sats 82%. Pt immediately brought back to room 17, placed on 3L via [place], 93% on 3L. Pt states hx of fluid retention and usually sleeps with Bipap but has not been sleeping with it due to the mask is broken.   Pt able to speak in full sentences however with noted difficulty.   Pt also states she feels like she has a "really bad UTI", pt also c/o swelling to BLE.

## 2020-05-11 NOTE — ED Notes (Signed)
Pt bradycardiac in the 40s for a couple of seconds. Dr. Lenard Lance, MD made aware.

## 2020-05-11 NOTE — ED Notes (Signed)
Pt O2 flow rate increased to 6L [place]. Pt was asleep with a hx of sleep apnea. Pt's O2 levels decreased to 83%. Pt O2 levels 100% at this time.

## 2020-05-11 NOTE — Progress Notes (Signed)
Was called to ER to help transport patient to ICU. BIPAP was on IPAP of 24. RN stated Dr. Karna Christmas had changed.

## 2020-05-11 NOTE — ED Notes (Signed)
Called lab to draw blood work.

## 2020-05-11 NOTE — Progress Notes (Signed)
   05/11/20 0930  Clinical Encounter Type  Visited With Patient  Visit Type Initial  Referral From Nurse  Consult/Referral To Chaplain  Responded to OR for prayer. PT was sleeping when I went into the room. I prayed silently beside the bed. Her nurse said she would let her know I came by the room. Ch will follow-up with Pt.

## 2020-05-11 NOTE — ED Notes (Signed)
Called Respiratory to placed pt on CPAP.

## 2020-05-11 NOTE — ED Notes (Signed)
First Nurse Note: Pt to ED via POV for shortness of breath. Pt is tachypneic upon arrival. Pts SpO2 sat 82% on room air. Pt does not wear O2 at home.

## 2020-05-11 NOTE — Progress Notes (Addendum)
ANTICOAGULATION CONSULT NOTE - Initial Consult  Pharmacy Consult for Rivaroxaban Indication: atrial fibrillation  No Known Allergies  Patient Measurements: Height: 5' 2.5" (158.8 cm) Weight: (!) 206.8 kg (455 lb 14.6 oz) IBW/kg (Calculated) : 51.25  Vital Signs: Temp: 98 F (36.7 C) (07/24 0814) Temp Source: Oral (07/24 0814) BP: 138/83 (07/24 1315) Pulse Rate: 79 (07/24 1315)  Labs: Recent Labs    05/11/20 0823 05/11/20 1107  HGB 12.9  --   HCT 43.4  --   PLT 283  --   CREATININE 0.96  --   TROPONINIHS 11 11    Estimated Creatinine Clearance: 113.1 mL/min (by C-G formula based on SCr of 0.96 mg/dL).   Medical History: Past Medical History:  Diagnosis Date  . Atrial flutter (HCC)   . Depression   . Hypothyroidism   . Iron deficiency   . OSA (obstructive sleep apnea)   . Vitamin B12 deficiency     Assessment: 59 yo female on rivaroxaban PTA for AFib consulted to continue dosing. Last dose 7/24 at 0700.  Goal of Therapy:  Monitor platelets by anticoagulation protocol: Yes   Plan:  Continue home dose of rivaroxaban 20 mg PO daily with meal. Per med rec, pt took dose this AM so will time next dose for tomorrow with breakfast.  SCr and CBC at least every three days per policy  Pharmacy will continue to follow.   Marty Heck 05/11/2020,1:25 PM

## 2020-05-11 NOTE — H&P (Signed)
History and Physical:    Alexandra Black   OIZ:[id number] DOB: 09/08/1961 DOA: 05/11/2020  Referring MD/provider: Dr. Marylene Land PCP: Franciso Bend, NP   Patient coming from: Home  Chief Complaint: Increasing shortness of breath from baseline.  History is primarily per patient's sister and a little bit from her daughter.  Patient is sleepy and on BiPAP and unable to give a history.  History of Present Illness:   Alexandra Black is an 59 y.o. female with past medical history significant for morbid obesity BMI greater than 70, COPD/chronic bronchitis, OSA, DM 2, probable congestive heart failure, who recently moved to this area from [place] and has had difficulty getting her CPAP mask and medications filled since her move 3 months ago.  Patient was in her usual state of health until 2 to 3 days ago when she complained of new hemoptysis and increasing shortness of breath as well as dysuria.  Of note patient is short of breath at baseline.  Per patient's sister who is her power of attorney and her caregiver, patient become short of breath with speaking and with eating.  She is however able to walk across the room and to the kitchen using a walker at baseline.  Since her move patient has had decreasing functional capacity.  Also of note patient is supposed to have a home health aide to help with her personal hygiene and apparently the home health aide has not been coming so patient has been incontinent of urine and sitting in her urine.  Patient sister advised her to come to the ED when she complained of dysuria and increasing shortness of breath yesterday.  Patient sister and daughter do not think that patient has had fevers.  Patient's daughter did see the hemoptysis and thought that the phlegm was faintly pink.  She herself attributed it to patient's coughing.  Of note patient is on Xarelto however it is unclear whether or not patient has been taking the Xarelto as prescribed as  patient usually manages her own medications and it is unclear whether she has been getting them filled.  Patient's daughter states that she has not been taking her "water pills".  Of note patient had been admitted to a hospital in [place] 1 year ago with similar symptoms and was possibly diagnosed with congestive heart failure but it is unclear.  Patient has never had a heart attack as far as her sister and daughter know.   ED Course:  The patient was noted to be hypoxic with O2 saturations in the mid 70s on room air.  Chest x-ray done showed interstitial infiltrates consistent with pulmonary edema.  She was given 60 mg Lasix IV and has put out 300 cc so far. She was initially started on CPAP however continued to desaturate and was transitioned to BiPAP.  ROS:   ROS   Review of Systems: Patient unable to provide  Past Medical History:   Past Medical History:  Diagnosis Date  . Atrial flutter (HCC)   . Depression   . Hypothyroidism   . Iron deficiency   . OSA (obstructive sleep apnea)   . Vitamin B12 deficiency     Past Surgical History:   History reviewed. No pertinent surgical history.  Social History:   Social History   Socioeconomic History  . Marital status: Divorced    Spouse name: Not on file  . Number of children: Not on file  . Years of education: Not on file  . Highest education level:  Not on file  Occupational History  . Not on file  Tobacco Use  . Smoking status: Current Every Day Smoker  . Smokeless tobacco: Never Used  Substance and Sexual Activity  . Alcohol use: Yes    Alcohol/week: 2.0 standard drinks    Types: 2 Glasses of wine per week  . Drug use: Never  . Sexual activity: Not Currently  Other Topics Concern  . Not on file  Social History Narrative  . Not on file   Social Determinants of Health   Financial Resource Strain:   . Difficulty of Paying Living Expenses:   Food Insecurity:   . Worried About Programme researcher, broadcasting/film/videounning Out of Food in the Last Year:    . Baristaan Out of Food in the Last Year:   Transportation Needs:   . Freight forwarderLack of Transportation (Medical):   Marland Kitchen. Lack of Transportation (Non-Medical):   Physical Activity:   . Days of Exercise per Week:   . Minutes of Exercise per Session:   Stress:   . Feeling of Stress :   Social Connections:   . Frequency of Communication with Friends and Family:   . Frequency of Social Gatherings with Friends and Family:   . Attends Religious Services:   . Active Member of Clubs or Organizations:   . Attends BankerClub or Organization Meetings:   Marland Kitchen. Marital Status:   Intimate Partner Violence:   . Fear of Current or Ex-Partner:   . Emotionally Abused:   Marland Kitchen. Physically Abused:   . Sexually Abused:     Allergies   Patient has no known allergies.  Family history:   Family History  Problem Relation Age of Onset  . Heart disease Mother   . Hypertension Mother     Current Medications:   Prior to Admission medications   Medication Sig Start Date End Date Taking? Authorizing Provider  albuterol (VENTOLIN HFA) 108 (90 Base) MCG/ACT inhaler Inhale into the lungs every 6 (six) hours as needed for wheezing or shortness of breath.   Yes [provider]  Cholecalciferol 25 MCG (1000 UT) capsule Take 1,000 Units by mouth daily.   Yes [provider]  diltiazem (CARDIZEM) 120 MG tablet Take 120 mg by mouth daily.   Yes [provider]  Docosahexaenoic Acid 100 MG CAPS Take by mouth.   Yes [provider]  Fluticasone-Salmeterol (ADVAIR) 100-50 MCG/DOSE AEPB Inhale 1 puff into the lungs 2 (two) times daily.   Yes [provider]  ipratropium-albuterol (DUONEB) 0.5-2.5 (3) MG/3ML SOLN Take 3 mLs by nebulization.   Yes [provider]  levothyroxine (SYNTHROID) 100 MCG tablet Take 100 mcg by mouth daily before breakfast.    Yes [provider]  levothyroxine (SYNTHROID) 150 MCG tablet Take 150 mcg by mouth daily before breakfast.   Yes [provider]  Multiple Vitamin (MULTIVITAMIN ADULT PO) Take by mouth daily.   Yes [provider]  potassium chloride (MICRO-K) 10 MEQ CR capsule Take 10 mEq by mouth daily.   Yes [provider]  traMADol (ULTRAM) 50 MG tablet Take by mouth every 6 (six) hours as needed.   Yes [provider]  triamterene-hydrochlorothiazide (DYAZIDE) 37.5-25 MG capsule Take 1 capsule by mouth daily.   Yes [provider]  venlafaxine XR (EFFEXOR-XR) 150 MG 24 hr capsule Take 150 mg by mouth daily with breakfast.   Yes [provider]  allopurinol (ZYLOPRIM) 100 MG tablet Take 100 mg by mouth daily.    [provider]  Biotin 1000 MCG tablet Take 1,000 mcg by mouth daily.    [provider]  Cyanocobalamin (B-12) 5000 MCG CAPS Take by mouth daily.    [provider]  ELDERBERRY PO Take by mouth daily.    [provider]  furosemide (LASIX) 40 MG tablet Take 40 mg by mouth daily.    [provider]  rivaroxaban (XARELTO) 20 MG TABS tablet Take 20 mg by mouth daily with supper.    [provider]  VITAMIN D PO Take by mouth daily.    [provider]  vitamin E 180 MG (400 UNITS) capsule Take 400 Units by mouth daily.    [provider]    Physical Exam:   Vitals:   05/11/20 1015 05/11/20 1109 05/11/20 1200 05/11/20 1230  BP: (!) 171/66 (!) 137/95 (!) 147/78 (!) 141/91  Pulse: 67 79 88 75  Resp: (!) 25 22 23 20   Temp:      TempSrc:      SpO2: 93% 93% 93% 91%  Weight:      Height:         Physical Exam: Blood pressure (!) 141/91, pulse 75, temperature 98 F (36.7 C), temperature source Oral, resp. rate 20, height 5' 2.5" (1.588 m), weight (!) 206.8 kg, SpO2 91 %. Gen: Massively obese female lying in stretcher with CPAP on face sleeping with attentive daughter at bedside CVS: Distant heart sounds difficult to hear due to body habitus and transmitted respiratory sound Respiratory: Mechanical  breath sounds GI: Normoactive bowel sounds. Obese.  Ventral hernia.  Soft.  Nontender. LE: Trace edema bilaterally Neuro: Somnolent, arousable by voice alone but goes back to sleep   Data Review:    Labs: Basic Metabolic Panel: Recent Labs  Lab 05/11/20 0823  NA 141  K 4.9  CL 103  CO2 28  GLUCOSE 172*  BUN 17  CREATININE 0.96  CALCIUM 9.1   Liver Function Tests: Recent Labs  Lab 05/11/20 0823  AST 56*  ALT 49*  ALKPHOS 110  BILITOT 1.1  PROT 7.9  ALBUMIN 3.9   No results for input(s): LIPASE, AMYLASE in the last 168 hours. No results for input(s): AMMONIA in the last 168 hours. CBC: Recent Labs  Lab 05/11/20 0823  WBC 10.4  HGB 12.9  HCT 43.4  MCV 78.5*  PLT 283   Cardiac Enzymes: No results for input(s): CKTOTAL, CKMB, CKMBINDEX, TROPONINI in the last 168 hours.  BNP (last 3 results) No results for input(s): PROBNP in the last 8760 hours. CBG: No results for input(s): GLUCAP in the last 168 hours.  Urinalysis No results found for: COLORURINE, APPEARANCEUR, LABSPEC, PHURINE, GLUCOSEU, HGBUR, BILIRUBINUR, KETONESUR, PROTEINUR, UROBILINOGEN, NITRITE, LEUKOCYTESUR    Radiographic Studies: DG Chest Portable 1 View  Result Date: 05/11/2020 CLINICAL DATA:  Shortness of breath beginning last night. EXAM: PORTABLE CHEST 1 VIEW COMPARISON:  None. FINDINGS: Lordotic technique is demonstrated. Lungs are adequately inflated demonstrate hazy prominence of the perihilar markings suggesting a degree of vascular congestion. No evidence of effusion. Minimal linear density over the lateral left midlung likely atelectasis. Cardiomegaly. Prominent overlying soft tissues. IMPRESSION: Mild cardiomegaly and findings suggesting mild interstitial edema. Minimal linear atelectasis left midlung. Electronically Signed   By: 05/13/2020 M.D.   On: 05/11/2020 09:03    EKG: Independently reviewed.  Sinus rhythm at 90.  P pulmonale.  Right axis deviation.  Isolated Q in 3.  No  acute ST-T wave changes.   Assessment/Plan:   Principal  Problem:   Acute respiratory failure with hypoxia (HCC) Active Problems:   OSA (obstructive sleep apnea)   Atrial flutter (HCC)   Depression   Hypothyroidism   COPD (chronic obstructive pulmonary disease) (HCC)   Morbid obesity with BMI of 70 and over, adult (HCC)   GERD (gastroesophageal reflux disease)   Type 2 diabetes mellitus without complication (HCC)  Acute on chronic respiratory failure with hypoxia Multifactorial including likely obesity hypoventilation with likely consequent pulmonary hypertension, OSA which has not been treated lately, obstructive disease/COPD/bronchitis as well as infiltrative disease with pulmonary edema. Chest x-ray examination is limited, physical examination is limited by body habitus and were unable to do a CT due to weight limits.   Will initiate treatment for COPD possible flare, CHF and obesity hypoventilation. Patient is presently saturating the mid 90s on BiPAP which we will continue. pH was 7.25 with CO2 81, respiratory therapy will try to increase rate of BiPAP to see if CO2 can be blown off. CODE STATUS and goals for care were discussed with patient's sister Orlene Plum.  Patient is indeed full code and would like to be intubated if warranted.  I spoke clearly with Ms. Hyman Hopes about the extremely poor prognosis for the patient should she be intubated and Ms. Hyman Hopes states she understands. Will request pulmonary critical care evaluation and recommendations  COPD Continue DuoNebs every 6 hours Solu-Medrol 40 every 6 Azithromycin 500 mg IV x3 days  CHF Unclear if patient has systolic, diastolic or both Echocardiogram ordered, I am unable to find any documentation on care everywhere however patient's sister states she will try to bring in whatever records she may have. Lasix 80 every 12x3 doses ordered. Patient does not appear to be on ACE inhibitor or beta-blocker as an  outpatient.  Hemoptysis Unclear etiology possibly secondary to coughing while on Xarelto Will await pulmonary critical care recommendations  Dysuria Unclear if this is cystitis versus poor hygiene and breakdown of skin in that area UA ordered Nursing care as warranted  Paroxysmal atrial fibrillation/flutter Continue diltiazem per home doses Continue Xarelto, appreciate pharmacy consultation  DM2 Hold Metformin SSI AC at bedtime Carb controlled diet  Depression Continue venlafaxine    Other information:   DVT prophylaxis: Xarelto ordered. Code Status: Full code Family Communication: Spoke with patient's Sister Orlene Plum who would like to be called with updates Disposition Plan: TBD Consults called: Pulmonary critical care Admission status: Inpatient  Shizuko Wojdyla Tublu Danah Reinecke [facility]  If 7PM-7AM, please contact night-coverage [email or web address] Password [email or web address] 05/11/2020, 1:05 PM

## 2020-05-11 NOTE — ED Provider Notes (Signed)
[facility] Emergency Department Provider Note  Time seen: 8:21 AM  I have reviewed the triage vital signs and the nursing notes.   HISTORY  Chief Complaint Respiratory Distress   HPI Alexandra Black is a 59 y.o. female with a past medical history of depression, hypothyroidism, obstructive sleep apnea, morbid obesity, presents to the emergency department for shortness of breath.  According to the patient for the past 1 month she has had progressively worsening shortness of breath with cough.  Also has noted increased swelling in her lower extremities.  Patient denies any fever.  Upon arrival patient has a respiratory rate in the 40s satting 82% on room air with no baseline O2 requirement.  Patient has never been prescribed O2 for home use per patient.  Is prescribed a CPAP at night but has not been using it.  Patient also admits she has not been taking her fluid pills.   Past Medical History:  Diagnosis Date  . Atrial flutter (HCC)   . Depression   . Hypothyroidism   . Iron deficiency   . OSA (obstructive sleep apnea)   . Vitamin B12 deficiency     There are no problems to display for this patient.   History reviewed. No pertinent surgical history.  Prior to Admission medications   Medication Sig Start Date End Date Taking? Authorizing Provider  allopurinol (ZYLOPRIM) 100 MG tablet Take 100 mg by mouth daily.    [provider]  Biotin 1000 MCG tablet Take 1,000 mcg by mouth daily.    [provider]  Cyanocobalamin (B-12) 5000 MCG CAPS Take by mouth daily.    [provider]  diltiazem (CARDIZEM) 120 MG tablet Take 120 mg by mouth daily.    [provider]  ELDERBERRY PO Take by mouth daily.    [provider]  furosemide (LASIX) 40 MG tablet Take 40 mg by mouth daily.    [provider]  levothyroxine (SYNTHROID) 150 MCG tablet Take 150 mcg by mouth daily before breakfast.    [provider]   Multiple Vitamin (MULTIVITAMIN ADULT PO) Take by mouth daily.    [provider]  potassium chloride (MICRO-K) 10 MEQ CR capsule Take 10 mEq by mouth daily.    [provider]  rivaroxaban (XARELTO) 20 MG TABS tablet Take 20 mg by mouth daily with supper.    [provider]  venlafaxine XR (EFFEXOR-XR) 150 MG 24 hr capsule Take 150 mg by mouth daily with breakfast.    [provider]  VITAMIN D PO Take by mouth daily.    [provider]  vitamin E 180 MG (400 UNITS) capsule Take 400 Units by mouth daily.    [provider]    No Known Allergies  Family History  Problem Relation Age of Onset  . Heart disease Mother   . Hypertension Mother     Social History Social History   Tobacco Use  . Smoking status: Current Every Day Smoker  . Smokeless tobacco: Never Used  Substance Use Topics  . Alcohol use: Yes    Alcohol/week: 2.0 standard drinks    Types: 2 Glasses of wine per week  . Drug use: Never    Review of Systems Constitutional: Negative for fever. Cardiovascular: Negative for chest pain. Respiratory: Positive for shortness of breath worse over the past 1 month.  Positive for cough Gastrointestinal: Negative for abdominal pain, vomiting GU: Patient does state mild dysuria Musculoskeletal: Increased lower extremity edema Neurological:  Negative for headache All other ROS negative  ____________________________________________   PHYSICAL EXAM:  VITAL SIGNS: ED Triage Vitals  Enc Vitals Group     BP 05/11/20 0814 (!) 170/87     Pulse Rate 05/11/20 0803 91     Resp 05/11/20 0803 (!) 40     Temp 05/11/20 0814 98 F (36.7 C)     Temp Source 05/11/20 0814 Oral     SpO2 05/11/20 0803 (!) 82 %     Weight 05/11/20 0813 (!) 455 lb 14.6 oz (206.8 kg)     Height 05/11/20 0813 5' 2.5" (1.588 m)     Head Circumference --      Peak Flow --      Pain Score 05/11/20 0813 0     Pain Loc --      Pain Edu? --      Excl. in  GC? --     Constitutional: Alert and oriented. Well appearing and in no distress. Eyes: Normal exam ENT      Head: Normocephalic and atraumatic.      Mouth/Throat: Mucous membranes are moist. Cardiovascular: Normal rate, regular rhythm. Respiratory: Normal respiratory effort without tachypnea nor retractions. Breath sounds are clear Gastrointestinal: Soft, obese, no obvious significant tenderness although exam somewhat limited by body habitus. Musculoskeletal: Nontender lower extremities patient does appear to have peripheral edema 2+, again exam limited by body habitus Neurologic:  Normal speech and language. No gross focal neurologic deficits  Skin:  Skin is warm, dry and intact.  Psychiatric: Mood and affect are normal.   ____________________________________________    EKG  EKG viewed and interpreted by myself shows a normal sinus rhythm at 90 bpm with a narrow QRS, right axis deviation, largely normal intervals with nonspecific ST changes  ____________________________________________    RADIOLOGY  Chest x-ray consistent with interstitial edema  ____________________________________________   INITIAL IMPRESSION / ASSESSMENT AND PLAN / ED COURSE  Pertinent labs & imaging results that were available during my care of the patient were reviewed by me and considered in my medical decision making (see chart for details).   Patient presents emergency department for shortness of breath.  Patient has a history of peripheral edema has not been taking her fluid pills patient does have increased lower extremity edema over baseline per patient.  Patient has not been using her CPAP.  Patient is morbidly obese has peripheral edema, differential this time would include pulmonary edema, pneumonia, pneumothorax, URI, viral URI.  Patient is satting well on nasal cannula oxygen.  We will check labs, chest x-ray, swab for corona and continue to closely monitor.  Patient has received her Covid  vaccinations.  Chest x-ray consistent with interstitial edema.  We will place the patient on IV Lasix.  Patient will be admitted to the hospital service for further work-up.  Patient does desat while sleeping supposed to wear a CPAP at night.  We have ordered a CPAP to be used as needed for sleeping.  Evalyse Stroope was evaluated in Emergency Department on 05/11/2020 for the symptoms described in the history of present illness. She was evaluated in the context of the global COVID-19 pandemic, which necessitated consideration that the patient might be at risk for infection with the SARS-CoV-2 virus that causes COVID-19. Institutional protocols and algorithms that pertain to the evaluation of patients at risk for COVID-19 are in a state of rapid change based on information released by regulatory bodies including the CDC and federal and state organizations. These policies and  algorithms were followed during the patient's care in the ED.  ____________________________________________   FINAL CLINICAL IMPRESSION(S) / ED DIAGNOSES  Dyspnea CHF exacerbation Pulmonary edema   Minna Antis, MD 05/11/20 1127

## 2020-05-12 ENCOUNTER — Inpatient Hospital Stay: Payer: Medicare Other

## 2020-05-12 ENCOUNTER — Inpatient Hospital Stay (HOSPITAL_COMMUNITY)
Admit: 2020-05-12 | Discharge: 2020-05-12 | Disposition: A | Discharge status: Home / Self Care | Payer: Medicare Other | Attending: Internal Medicine | Admitting: Internal Medicine

## 2020-05-12 ENCOUNTER — Encounter: Payer: Self-pay | Admitting: Internal Medicine

## 2020-05-12 DIAGNOSIS — R0602 Shortness of breath: Secondary | ICD-10-CM

## 2020-05-12 DIAGNOSIS — J9601 Acute respiratory failure with hypoxia: Secondary | ICD-10-CM | POA: Diagnosis not present

## 2020-05-12 LAB — CBC
HCT: 45 % (ref 36.0–46.0)
Hemoglobin: 13.2 g/dL (ref 12.0–15.0)
MCH: 23 pg — ABNORMAL LOW (ref 26.0–34.0)
MCHC: 29.3 g/dL — ABNORMAL LOW (ref 30.0–36.0)
MCV: 78.4 fL — ABNORMAL LOW (ref 80.0–100.0)
Platelets: 303 10*3/uL (ref 150–400)
RBC: 5.74 MIL/uL — ABNORMAL HIGH (ref 3.87–5.11)
RDW: 15.6 % — ABNORMAL HIGH (ref 11.5–15.5)
WBC: 9 10*3/uL (ref 4.0–10.5)
nRBC: 0.3 % — ABNORMAL HIGH (ref 0.0–0.2)

## 2020-05-12 LAB — BASIC METABOLIC PANEL
Anion gap: 10 (ref 5–15)
BUN: 15 mg/dL (ref 6–20)
CO2: 36 mmol/L — ABNORMAL HIGH (ref 22–32)
Calcium: 9.4 mg/dL (ref 8.9–10.3)
Chloride: 96 mmol/L — ABNORMAL LOW (ref 98–111)
Creatinine, Ser: 0.96 mg/dL (ref 0.44–1.00)
GFR calc Af Amer: >60 mL/min (ref >60)
GFR calc non Af Amer: >60 mL/min (ref >60)
Glucose, Bld: 171 mg/dL — ABNORMAL HIGH (ref 70–99)
Potassium: 4.4 mmol/L (ref 3.5–5.1)
Sodium: 142 mmol/L (ref 135–145)

## 2020-05-12 LAB — BLOOD GAS, ARTERIAL
Acid-Base Excess: 13.1 mmol/L — ABNORMAL HIGH (ref 0.0–2.0)
Bicarbonate: 40.6 mmol/L — ABNORMAL HIGH (ref 20.0–28.0)
Delivery systems: POSITIVE
Expiratory PAP: 10
FIO2: 0.4
Inspiratory PAP: 24
Mechanical Rate: 24
O2 Saturation: 90.4 %
Patient temperature: 37
RATE: 24 resp/min
pCO2 arterial: 64 mmHg — ABNORMAL HIGH (ref 32.0–48.0)
pH, Arterial: 7.41 (ref 7.350–7.450)
pO2, Arterial: 59 mmHg — ABNORMAL LOW (ref 83.0–108.0)

## 2020-05-12 LAB — GLUCOSE, CAPILLARY
Glucose-Capillary: 151 mg/dL — ABNORMAL HIGH (ref 70–99)
Glucose-Capillary: 179 mg/dL — ABNORMAL HIGH (ref 70–99)
Glucose-Capillary: 171 mg/dL — ABNORMAL HIGH (ref 70–99)
Glucose-Capillary: 169 mg/dL — ABNORMAL HIGH (ref 70–99)
Glucose-Capillary: 188 mg/dL — ABNORMAL HIGH (ref 70–99)

## 2020-05-12 LAB — HEMOGLOBIN A1C
Hgb A1c MFr Bld: 6.5 % — ABNORMAL HIGH (ref 4.8–5.6)
Mean Plasma Glucose: 139.85 mg/dL

## 2020-05-12 LAB — HIV ANTIBODY (ROUTINE TESTING W REFLEX): HIV Screen 4th Generation wRfx: NONREACTIVE

## 2020-05-12 LAB — MAGNESIUM: Magnesium: 1.9 mg/dL (ref 1.7–2.4)

## 2020-05-12 LAB — PHOSPHORUS: Phosphorus: 4.3 mg/dL (ref 2.5–4.6)

## 2020-05-12 MED ORDER — ORAL CARE MOUTH RINSE
15.0000 mL | Freq: Two times a day (BID) | OROMUCOSAL | Status: DC
  Administered 2020-05-12 – 2020-05-17 (×10): 15 mL via OROMUCOSAL

## 2020-05-12 MED ORDER — POLYETHYLENE GLYCOL 3350 17 G PO PACK
17.0000 g | PACK | Freq: Once | ORAL | Status: AC
  Administered 2020-05-12: 17 g via ORAL
  Filled 2020-05-12: qty 1

## 2020-05-12 MED ORDER — SENNOSIDES-DOCUSATE SODIUM 8.6-50 MG PO TABS
1.0000 | ORAL_TABLET | Freq: Two times a day (BID) | ORAL | Status: DC
  Administered 2020-05-12 – 2020-05-16 (×8): 1 via ORAL
  Filled 2020-05-12 (×10): qty 1

## 2020-05-12 NOTE — Progress Notes (Signed)
Report called to [facility] on 1C, pt will transport w/port O2, VSS, agreeable to transfer. Family at bedside

## 2020-05-12 NOTE — Progress Notes (Signed)
Consult placed for 2nd IV site. Pt has an IV site that is saline locked and flushes with good blood return. Pt has adequate access for medication needs at this time. Bedside RN notified.  Gilman Schmidt, RN VAST

## 2020-05-12 NOTE — Evaluation (Signed)
Physical Therapy Evaluation Patient Details Name: Alexandra Black MRN: [id number] DOB: 23-Jan-1961 Today's Date: 05/12/2020   History of Present Illness  Alexandra Black is a 59 y/o female who was admitted for acute respiratory failure with hypoxia. Chief complaints included hemoptysis, increasing SOB, and dysuria. PMH includes morbid obesity (BMI greater tahn 70), COPD/chronic bronchitis, OSA, probable congestive heart failure, SOB at baseline, A-flutter, depression, hypothyroidism, iron deficiency, and vitamin B12 deficiency.  Clinical Impression  Pt received in bed upon arrival to room. Pt a little reluctant but agreeable to PT session. Pt deferred OOB and upright activities however agreeable to bed level eval. Pt on 12L O2 via high flow nasal cannula throughout session. Pt performed therex in bed with decreased range of movements secondary to body habitus. Pt perfomred rolling to R and L with heavy utilization of bedrails and able to initiate and move upper trunk with minimal movement at or below the waist. Mod A for lower body assist however pt reporting LLE pain and skin tenderness behind BLE. Body habitus also preventing proper space to complete full roll into sidelying position. Pt educated on importance of performing exercises between sessions for carryover of strength and mobility. Pt currently presents with decreased strength, functional activity tolerance, and LE pain. Recommend skilled PT to address deficits during acute stay. Anticipate pt will need assistance with upright and OOB activities. At this point, recommend STR to maximize functional mobility and independence and optimize return to PLOF.  Vitals (on 12L O2): Pre: 87 HR, 95%, 21 RR During: 86%, RR 35, BP 131/84 Post: 89 HR, 93%, 21 RR    Follow Up Recommendations SNF    Equipment Recommendations  3in1 (PT)    Recommendations for Other Services       Precautions / Restrictions Precautions Precautions:  Fall Restrictions Weight Bearing Restrictions: No      Mobility  Bed Mobility Overal bed mobility: Needs Assistance Bed Mobility: Rolling Rolling: Mod assist         General bed mobility comments: Pt performed rolling sit to side with heavy utilization of bedrails and able to initiate movement of upper body. Mod A for lower body intiation. Pt unable to come into full sidelying position in bed due to body habitus and not having enough space to perform.  Transfers                 General transfer comment: Pt deferred OOB or upright attempts  Ambulation/Gait             General Gait Details: not attempted  Stairs            Wheelchair Mobility    Modified Rankin (Stroke Patients Only)       Balance Overall balance assessment:  (not attempted)                                           Pertinent Vitals/Pain Pain Assessment: Faces Faces Pain Scale: Hurts whole lot Pain Location: LLE Pain Descriptors / Indicators: Grimacing;Discomfort;Aching (grunting) Pain Intervention(s): Monitored during session;Limited activity within patient's tolerance    Home Living Family/patient expects to be discharged to:: Private residence Living Arrangements: Alone Available Help at Discharge: Family;Available PRN/intermittently Type of Home: Apartment (condo) Home Access: Other (comment) (1 curb to go up to level condo)     Home Layout: One level Home Equipment: Walker - 2 wheels;Walker - 4 wheels;Cane -  single point;Shower seat - built in Additional Comments: Pt reports that her walk in shower has a seat but she doesn't feel safe using it. She also reports that she attempted to order a 3-in-1 but her insurance would not cover it.    Prior Function Level of Independence: Independent with assistive device(s)         Comments: Pt reports use of RW for ambulation and states that she ambulates household distances. Pt also reports mod I with  transfers from bed stating that she rolls onto her stomach and pushes up with her arms to get up. Pt reporting that she often uses the bathroom (urinates and deficate) on herself in her chair, her bed, and on the floor in her condo.      Hand Dominance        Extremity/Trunk Assessment   Upper Extremity Assessment Upper Extremity Assessment: Generalized weakness (grossly 3 to 3+/5 bilaterally)    Lower Extremity Assessment Lower Extremity Assessment: Generalized weakness (grossly 3 to 3+/5 bilaterally)       Communication   Communication: No difficulties  Cognition Arousal/Alertness: Awake/alert Behavior During Therapy: WFL for tasks assessed/performed;Anxious Overall Cognitive Status: Within Functional Limits for tasks assessed                                 General Comments: Pt A&O x 4.      General Comments      Exercises Other Exercises Other Exercises: pt performed BLE therex in semi-supine position, including: AP, heel slides, and sliding hip ab/add x 10   Assessment/Plan    PT Assessment Patient needs continued PT services  PT Problem List Decreased strength;Decreased activity tolerance;Decreased balance;Decreased mobility;Obesity;Pain;Cardiopulmonary status limiting activity       PT Treatment Interventions DME instruction;Gait training;Functional mobility training;Therapeutic activities;Therapeutic exercise;Balance training;Patient/family education    PT Goals (Current goals can be found in the Care Plan section)  Acute Rehab PT Goals Patient Stated Goal: to get better PT Goal Formulation: With patient Time For Goal Achievement: 05/26/20 Potential to Achieve Goals: Fair    Frequency Min 2X/week   Barriers to discharge Decreased caregiver support Pt lives alone and will not have 24/7 assist. Pt also is between caregiver services and according to her report, has used the bathroom all over her house.    Co-evaluation                AM-PAC PT "6 Clicks" Mobility  Outcome Measure Help needed turning from your back to your side while in a flat bed without using bedrails?: A Lot Help needed moving from lying on your back to sitting on the side of a flat bed without using bedrails?: A Lot Help needed moving to and from a bed to a chair (including a wheelchair)?: A Lot Help needed standing up from a chair using your arms (e.g., wheelchair or bedside chair)?: A Lot Help needed to walk in hospital room?: A Lot Help needed climbing 3-5 steps with a railing? : A Lot 6 Click Score: 12    End of Session Equipment Utilized During Treatment: Oxygen (12L O2 via high flow nasal cannula) Activity Tolerance: Patient limited by fatigue Patient left: in bed;with call bell/phone within reach Nurse Communication: Mobility status PT Visit Diagnosis: Unsteadiness on feet (R26.81);Muscle weakness (generalized) (M62.81);Pain Pain - Right/Left: Left Pain - part of body: Leg    Time: 5631-4970 PT Time Calculation (min) (ACUTE ONLY): 27 min  Charges:              Frederich Chick, SPT  Frederich Chick 05/12/2020, 3:01 PM

## 2020-05-12 NOTE — Progress Notes (Signed)
CRITICAL CARE PROGRESS NOTE    Name: Alexandra Black MRN: [id number] DOB: 1960-11-28     LOS: 1 Referring physician: Dr Reine Just.   SUBJECTIVE FINDINGS & SIGNIFICANT EVENTS    Patient description:  72F with hx of BMI>80, COPD, OSA, DM2,who came with her sister for SOB 3d. She was also found to be mildy lethargic. She reported an episode of non-massive hemoptysis which resolved on its own, she is on xarelto. 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    Daughter is present Barrett Henle) she states patient has recurrent episodes of hypercapnic encephalopathy. Patient has not been able to use her home bipap due to problems with interface.    05/12/20 - patient has improved clinically with less AMS, ABG is improved. Plan to optimize for TRH transfer.   Lines/tubes : External Urinary Catheter (Active)  Collection Container Standard drainage bag 05/11/20 1111  Securement Method Tape 05/11/20 1111  Site Assessment Clean;Intact;Dry 05/11/20 1111    Microbiology/Sepsis markers: Results for orders placed or performed during the hospital encounter of 05/11/20  SARS Coronavirus 2 by RT PCR (hospital order, performed in [facility] hospital lab) Nasopharyngeal Nasopharyngeal Swab     Status: None   Collection Time: 05/11/20  8:52 AM   Specimen: Nasopharyngeal Swab  Result Value Ref Range Status   SARS Coronavirus 2 NEGATIVE NEGATIVE Final    Comment: (NOTE) SARS-CoV-2 target nucleic acids are NOT DETECTED.  The SARS-CoV-2 RNA is generally detectable in upper and lower respiratory specimens during the acute phase of infection. The lowest concentration of SARS-CoV-2 viral copies this assay can detect is 250 copies / mL. A negative result does not preclude  SARS-CoV-2 infection and should not be used as the sole basis for treatment or other patient management decisions.  A negative result may occur with improper specimen collection / handling, submission of specimen other than nasopharyngeal swab, presence of viral mutation(s) within the areas targeted by this assay, and inadequate number of viral copies (<250 copies / mL). A negative result must be combined with clinical observations, patient history, and epidemiological information.  Fact Sheet for Patients:   BoilerBrush.com.cy  Fact Sheet for Healthcare Providers: https://pope.com/  This test is not yet approved or  cleared by the [place] FDA and has been authorized for detection and/or diagnosis of SARS-CoV-2 by FDA under an Emergency Use Authorization (EUA).  This EUA will remain in effect (meaning this test can be used) for the duration of the COVID-19 declaration under Section 564(b)(1) of the Act, 21 U.S.C. section 360bbb-3(b)(1), unless the authorization is terminated or revoked sooner.  Performed at [facility], [address] [place], [place] [address]   MRSA PCR Screening     Status: None   Collection Time: 05/11/20  7:50 PM   Specimen: Nasopharyngeal  Result Value Ref Range Status   MRSA by PCR NEGATIVE NEGATIVE Final    Comment:        The GeneXpert MRSA Assay (FDA approved for NASAL specimens only), is one component of a comprehensive MRSA colonization surveillance program. It is not intended to diagnose MRSA infection nor to guide or monitor treatment for MRSA infections. Performed at [facility], [address] [place], [place] [address]     Anti-infectives:  Anti-infectives (From admission, onward)   Start     Dose/Rate Route Frequency Ordered Stop   05/11/20 1500  azithromycin (ZITHROMAX) 500 mg in sodium chloride 0.9 % 250  mL IVPB     Discontinue     500 mg 250 mL/hr over  60 Minutes Intravenous Every 24 hours 05/11/20 1407 05/14/20 1459       Consults: Treatment Team:  Vida Rigger, MD Pccm, [facility], MD     Tests / Events: TTE ABG    PAST MEDICAL HISTORY   Past Medical History:  Diagnosis Date   Atrial flutter (HCC)    Depression    Hypothyroidism    Iron deficiency    OSA (obstructive sleep apnea)    Vitamin B12 deficiency      SURGICAL HISTORY   History reviewed. No pertinent surgical history.   FAMILY HISTORY   Family History  Problem Relation Age of Onset   Heart disease Mother    Hypertension Mother      SOCIAL HISTORY   Social History   Tobacco Use   Smoking status: Current Every Day Smoker   Smokeless tobacco: Never Used  Substance Use Topics   Alcohol use: Yes    Alcohol/week: 2.0 standard drinks    Types: 2 Glasses of wine per week   Drug use: Never     MEDICATIONS   Current Medication:  Current Facility-Administered Medications:    0.9 %  sodium chloride infusion, 250 mL, Intravenous, PRN, Luberta Robertson, Raynaldo Opitz, MD   acetaminophen (TYLENOL) tablet 650 mg, 650 mg, Oral, Q6H PRN, 650 mg at 05/12/20 0810 **OR** acetaminophen (TYLENOL) suppository 650 mg, 650 mg, Rectal, Q6H PRN, Leandro Reasoner Tublu, MD   azithromycin (ZITHROMAX) 500 mg in sodium chloride 0.9 % 250 mL IVPB, 500 mg, Intravenous, Q24H, Luberta Robertson, Raynaldo Opitz, MD, Paused at 05/11/20 2109   Chlorhexidine Gluconate Cloth 2 % PADS 6 each, 6 each, Topical, Daily, Eugenie Norrie, NP, 6 each at 05/11/20 2118   diltiazem (CARDIZEM) tablet 120 mg, 120 mg, Oral, Daily, Leandro Reasoner Tublu, MD, 120 mg at 05/12/20 4010   furosemide (LASIX) injection 80 mg, 80 mg, Intravenous, Q12H, Leandro Reasoner Tublu, MD, 80 mg at 05/12/20 0313   guaiFENesin (MUCINEX) 12 hr tablet 600 mg, 600 mg, Oral, BID PRN, Leandro Reasoner Tublu, MD, 600 mg at 05/12/20 0810   insulin aspart (novoLOG) injection 0-15 Units,  0-15 Units, Subcutaneous, TID WC, Pieter Partridge, MD, 3 Units at 05/12/20 2725   ipratropium-albuterol (DUONEB) 0.5-2.5 (3) MG/3ML nebulizer solution 3 mL, 3 mL, Nebulization, Q6H, Luberta Robertson, Raynaldo Opitz, MD, 3 mL at 05/12/20 0749   levothyroxine (SYNTHROID) tablet 150 mcg, 150 mcg, Oral, QAC breakfast, Leandro Reasoner Tublu, MD, 150 mcg at 05/12/20 0541   MEDLINE mouth rinse, 15 mL, Mouth Rinse, BID, Vida Rigger, MD, 15 mL at 05/12/20 0813   methylPREDNISolone sodium succinate (SOLU-MEDROL) 40 mg/mL injection 40 mg, 40 mg, Intravenous, Q6H, Leandro Reasoner Tublu, MD, 40 mg at 05/12/20 3664   polyethylene glycol (MIRALAX / GLYCOLAX) packet 17 g, 17 g, Oral, Daily PRN, Luberta Robertson, Raynaldo Opitz, MD   polyethylene glycol (MIRALAX / GLYCOLAX) packet 17 g, 17 g, Oral, Once, [place], Eric J, DO   potassium chloride SA (KLOR-CON) CR tablet 10 mEq, 10 mEq, Oral, Daily, Luberta Robertson, Horatio Pel Tublu, MD, 10 mEq at 05/12/20 0809   rivaroxaban (XARELTO) tablet 20 mg, 20 mg, Oral, Q breakfast, Regino Schultze, Daisy Floro, RPH   senna-docusate (Senokot-S) tablet 1 tablet, 1 tablet, Oral, BID, [place], Eric J, DO   sodium chloride flush (NS) 0.9 % injection 3 mL, 3 mL, Intravenous, Q12H, Leandro Reasoner Tublu, MD, 3 mL at 05/12/20 0808   sodium chloride flush (  NS) 0.9 % injection 3 mL, 3 mL, Intravenous, PRN, Luberta Robertson, Horatio Pel Tublu, MD   sorbitol 70 % solution 30 mL, 30 mL, Oral, Daily PRN, Leandro Reasoner Tublu, MD   venlafaxine XR (EFFEXOR-XR) 24 hr capsule 150 mg, 150 mg, Oral, Q breakfast, Leandro Reasoner Tublu, MD, 150 mg at 05/12/20 0809    ALLERGIES   Patient has no known allergies.    REVIEW OF SYSTEMS    10 point ROS not possible due to encephalopathy  PHYSICAL EXAMINATION   Vital Signs: Temp:  [97 F (36.1 C)-99.1 F (37.3 C)] 99.1 F (37.3 C) (07/25 0030) Pulse Rate:  [53-89] 88 (07/25 0500) Resp:  [17-28] 18 (07/25 0500) BP: (116-171)/(60-95)  125/92 (07/25 0500) SpO2:  [89 %-100 %] 92 % (07/25 0750) FiO2 (%):  [40 %] 40 % (07/25 0226) Weight:  [209.1 kg] 209.1 kg (07/24 1800)  GENERAL:older then stated age HEAD: Normocephalic, atraumatic.  EYES: Pupils equal, round, reactive to light.  No scleral icterus.  MOUTH: Moist mucosal membrane. NECK: Supple. No thyromegaly. No nodules. No JVD.  PULMONARY: decreased bs bilaterally  CARDIOVASCULAR: S1 and S2. Regular rate and rhythm. No murmurs, rubs, or gallops.  GASTROINTESTINAL: Soft, nontender, non-distended. No masses. Positive bowel sounds. No hepatosplenomegaly.  MUSCULOSKELETAL: No swelling, clubbing, or edema.  NEUROLOGIC: Mild distress due to acute illness SKIN:intact,warm,dry   PERTINENT DATA     Infusions:  sodium chloride     azithromycin Stopped (05/11/20 2109)   Scheduled Medications:  Chlorhexidine Gluconate Cloth  6 each Topical Daily   diltiazem  120 mg Oral Daily   furosemide  80 mg Intravenous Q12H   insulin aspart  0-15 Units Subcutaneous TID WC   ipratropium-albuterol  3 mL Nebulization Q6H   levothyroxine  150 mcg Oral QAC breakfast   mouth rinse  15 mL Mouth Rinse BID   methylPREDNISolone (SOLU-MEDROL) injection  40 mg Intravenous Q6H   polyethylene glycol  17 g Oral Once   potassium chloride  10 mEq Oral Daily   rivaroxaban  20 mg Oral Q breakfast   senna-docusate  1 tablet Oral BID   sodium chloride flush  3 mL Intravenous Q12H   venlafaxine XR  150 mg Oral Q breakfast   PRN Medications: sodium chloride, acetaminophen **OR** acetaminophen, guaiFENesin, polyethylene glycol, sodium chloride flush, sorbitol Hemodynamic parameters:   Intake/Output: 07/24 0701 - 07/25 0700 In: 281.2 [I.V.:3; IV Piggyback:278.2] Out: 3050 [Urine:3050]  Ventilator  Settings: FiO2 (%):  [40 %] 40 %   LAB RESULTS:  Basic Metabolic Panel: Recent Labs  Lab 05/11/20 0823 05/12/20 0554  NA 141 142  K 4.9 4.4  CL 103 96*  CO2 28 36*    GLUCOSE 172* 171*  BUN 17 15  CREATININE 0.96 0.96  CALCIUM 9.1 9.4  MG  --  1.9  PHOS  --  4.3   Liver Function Tests: Recent Labs  Lab 05/11/20 0823  AST 56*  ALT 49*  ALKPHOS 110  BILITOT 1.1  PROT 7.9  ALBUMIN 3.9   No results for input(s): LIPASE, AMYLASE in the last 168 hours. No results for input(s): AMMONIA in the last 168 hours. CBC: Recent Labs  Lab 05/11/20 0823 05/12/20 0554  WBC 10.4 9.0  HGB 12.9 13.2  HCT 43.4 45.0  MCV 78.5* 78.4*  PLT 283 303   Cardiac Enzymes: No results for input(s): CKTOTAL, CKMB, CKMBINDEX, TROPONINI in the last 168 hours. BNP: Invalid input(s): POCBNP CBG: Recent Labs  Lab 05/11/20 1840 05/12/20 0114 05/12/20  0738  GLUCAP 128* 151* 179*       IMAGING RESULTS:  Imaging: DG Chest Port 1 View  Result Date: 05/12/2020 CLINICAL DATA:  Acute respiratory failure. EXAM: PORTABLE CHEST 1 VIEW COMPARISON:  05/11/2020 FINDINGS: Cardiac enlargement and pulmonary vascular congestion is unchanged from previous exam. No pleural effusion or airspace consolidation. Platelike atelectasis in the left lower lobe appears unchanged. Visualized osseous structures are unremarkable. IMPRESSION: Stable cardiac enlargement and pulmonary vascular congestion. Electronically Signed   By: Signa Kellaylor  Stroud M.D.   On: 05/12/2020 08:14   DG Chest Portable 1 View  Result Date: 05/11/2020 CLINICAL DATA:  Shortness of breath beginning last night. EXAM: PORTABLE CHEST 1 VIEW COMPARISON:  None. FINDINGS: Lordotic technique is demonstrated. Lungs are adequately inflated demonstrate hazy prominence of the perihilar markings suggesting a degree of vascular congestion. No evidence of effusion. Minimal linear density over the lateral left midlung likely atelectasis. Cardiomegaly. Prominent overlying soft tissues. IMPRESSION: Mild cardiomegaly and findings suggesting mild interstitial edema. Minimal linear atelectasis left midlung. Electronically Signed   By: Elberta Fortisaniel   Boyle M.D.   On: 05/11/2020 09:03   @PROBHOSP @ DG Chest Port 1 View  Result Date: 05/12/2020 CLINICAL DATA:  Acute respiratory failure. EXAM: PORTABLE CHEST 1 VIEW COMPARISON:  05/11/2020 FINDINGS: Cardiac enlargement and pulmonary vascular congestion is unchanged from previous exam. No pleural effusion or airspace consolidation. Platelike atelectasis in the left lower lobe appears unchanged. Visualized osseous structures are unremarkable. IMPRESSION: Stable cardiac enlargement and pulmonary vascular congestion. Electronically Signed   By: Signa Kellaylor  Stroud M.D.   On: 05/12/2020 08:14     ASSESSMENT AND PLAN    -Multidisciplinary rounds held today  Altered mental status with lethargy   - patient is s/p ABG with hypecania and acedemia consistent with hypercapnic encephalopathy  - adjusted BIPAP with increased driving pressure  -patient takes off mask multiple times during my exam.   COPD/OSA/OHS overlap syndrome  - continue BIPAP 24/10    Graves Disease  - continue synthroid as per home regimen   Paroxysmal AF -Wandering atrial pacemaker during my evaluation this evening -on xarelto at home   Acute on chronic diastolic CHF  patient takes lasix at home , has 2+LE non pitting edema which may be realted to underlying Graves disease  -oxygen as needed -Lasix as tolerated ICU telemetry monitoring  ID -continue IV abx as prescibed -follow up cultures  GI/Nutrition GI PROPHYLAXIS as indicated DIET-->TF's as tolerated Constipation protocol as indicated  ENDO - ICU hypoglycemic\Hyperglycemia protocol -check FSBS per protocol   ELECTROLYTES -follow labs as needed -replace as needed -pharmacy consultation   DVT/GI PRX ordered -SCDs  TRANSFUSIONS AS NEEDED MONITOR FSBS ASSESS the need for LABS as needed   Critical care provider statement:    Critical care time (minutes):  33   Critical care time was exclusive of:  Separately billable procedures and treating other  patients   Critical care was necessary to treat or prevent imminent or life-threatening deterioration of the following conditions:  AMS with encephalopathy , COPD, OHS   Critical care was time spent personally by me on the following activities:  Development of treatment plan with patient or surrogate, discussions with consultants, evaluation of patient's response to treatment, examination of patient, obtaining history from patient or surrogate, ordering and performing treatments and interventions, ordering and review of laboratory studies and re-evaluation of patient's condition.  I assumed direction of critical care for this patient from another provider in my specialty: no    This  document was prepared using Dragon voice reConservation officer, historic buildingslude unintentional dictation errors.    Vida Rigger, M.D.  Division of Pulmonary & Critical Care Medicine  [name]

## 2020-05-12 NOTE — Progress Notes (Signed)
*  PRELIMINARY RESULTS* Echocardiogram 2D Echocardiogram has been performed.  Alexandra Black 05/12/2020, 2:47 PM

## 2020-05-12 NOTE — Progress Notes (Signed)
PROGRESS NOTE    Alexandra Black  FAO:[id number] DOB: 1961-03-26 DOA: 05/11/2020 PCP: Franciso Bendogers, Jennifer B, NP    Brief Narrative:  Alexandra Black is an 59 y.o. Black with past medical history significant for morbid obesity BMI greater than 70, COPD/chronic bronchitis, OSA, DM 2, probable congestive heart failure, who recently moved to this area from [place] and has had difficulty getting her CPAP mask and medications filled since her move 3 months ago.  Patient was in her usual state of health until 2 to 3 days ago when she complained of new hemoptysis and increasing shortness of breath as well as dysuria.  Per patient's sister who is her power of attorney and her caregiver, patient become short of breath with speaking and with eating.  She is however able to walk across the room and to the kitchen using a walker at baseline.  Since her move patient has had decreasing functional capacity.  Also of note patient is supposed to have a home health aide to help with her personal hygiene and apparently the home health aide has not been coming so patient has been incontinent of urine and sitting in her urine.  Patient sister advised her to come to the ED when she complained of dysuria and increasing shortness of breath yesterday.  Patient sister and daughter do not think that patient has had fevers.  Patient's daughter did see the hemoptysis and thought that the phlegm was faintly pink.  She herself attributed it to patient's coughing.  Of note patient is on Xarelto however it is unclear whether or not patient has been taking the Xarelto as prescribed as patient usually manages her own medications and it is unclear whether she has been getting them filled.  Patient's daughter states that she has not been taking her "water pills".  Of note patient had been admitted to a hospital in [place] 1 year ago with similar symptoms and was possibly diagnosed with congestive heart failure but it is unclear.   Patient has never had a heart attack as far as her sister and daughter know.  The patient was noted to be hypoxic with O2 saturations in the mid 70s on room air.  Chest x-ray done showed interstitial infiltrates consistent with pulmonary edema.  She was given 60 mg Lasix IV and has put out 300 cc so far. She was initially started on CPAP however continued to desaturate and was transitioned to BiPAP.  Assessment & Plan:   Principal Problem:   Acute respiratory failure with hypoxia (HCC) Active Problems:   OSA (obstructive sleep apnea)   Atrial flutter (HCC)   Depression   Hypothyroidism   COPD (chronic obstructive pulmonary disease) (HCC)   Morbid obesity with BMI of 70 and over, adult (HCC)   GERD (gastroesophageal reflux disease)   Type 2 diabetes mellitus without complication (HCC)   Acute on chronic hypercapnic respiratory failure Patient presenting with confusion.  Found to have elevated PaCO2 of 81.  Has been noncompliant with her home BiPAP versus CPAP secondary to issues with her "mass".  This is complicated by her severe morbid obesity with a BMI of 83 with likely underlying obesity hypoventilation syndrome/obstructive sleep apnea. --See treatment below  Acute COPD exacerbation --Continue Solu-Medrol 40 mg IV every 6 hours --Duo nebs every 6 hours --Continue supplemental oxygen, maintain SPO2 greater than 88%, currently on 12 L high flow nasal cannula  Obstructive sleep apnea Obesity hypoventilation syndrome BMI 83.97.  Previously on BiPAP/CPAP at home but issues with device. --  Continue BiPAP during the day while sleeping and nightly. --PCCM following, appreciate assistance --Transition of care for equipment issues  Acute on chronic diastolic congestive heart failure Recently moved to the area from [place].  No previous TTE available for review in the EMR.  Was previously was prescribed furosemide 40 mg p.o. daily, apparently not taking. --Net negative 2.8L since  admission --Furosemide 80 mg IV twice daily --Echocardiogram: Pending --Continue supplemental oxygen as above --Monitor renal function closely daily with aggressive IV diuresis  Hypothyroidism --Continue levothyroxine 150 mcg p.o. daily  Paroxysmal atrial fibrillation --Continue Cardizem 120 mg p.o. daily --Continue anticoagulation with Xarelto --Monitor on telemetry  Type 2 diabetes mellitus Reportedly not taking any medications at home. --Hemoglobin A1c: Pending --Insulin sliding scale for coverage --CBGs before every meal/at bedtime  Depression --Continue venlafaxine 150 mg p.o. daily  Severe morbid obesity Body mass index is 82.97 kg/m.  Patient previously underwent weight loss surgery with apparent banding at [facility] in [place].  Previously follows with bariatric surgery but has been lost to follow-up since 2018; in which she has had significant weight gain since.  Discussed with patient need for aggressive weight loss measures and lifestyle changes as this complicates all facets of care.  Would benefit from reengagement with bariatric surgery on discharge.   DVT prophylaxis: Xarelto Code Status: Full code Family Communication: No family present at bedside  Disposition Plan:  Status is: Inpatient  Remains inpatient appropriate because:Altered mental status and Unsafe d/c plan   Dispo: The patient is from: Home              Anticipated d/c is to: To be determined              Anticipated d/c date is: 3 days              Patient currently is not medically stable to d/c.  Consultants:   PCCM  Procedures:   TTE: Pending  Antimicrobials:   None   Subjective: Patient seen and examined bedside, resting comfortably.  Continues on high flow nasal cannula, 12 L/min.  Does not recall events leading up to hospitalization.  Feels her breathing is improved but not back to her normal baseline.  Extensively discussed why she has not been using her home CPAP/BiPAP unit  due to mask issues.  Frustrated that her physician in [place] was not able to correct these issues.  No other specific complaints at this time.  No family present at bedside.  Denies headache, no chest pain, no palpitations, no abdominal pain, no cough/congestion.  No acute events overnight per nursing staff.  Objective: Vitals:   05/12/20 1100 05/12/20 1200 05/12/20 1300 05/12/20 1400  BP: (!) 130/77 (!) 140/74 128/76 (!) 131/84  Pulse: 86 79 87 91  Resp: 23 20 19  (!) 26  Temp:  98.1 F (36.7 C)    TempSrc:  Axillary    SpO2: 94% 96% 90% 95%  Weight:      Height:        Intake/Output Summary (Last 24 hours) at 05/12/2020 1501 Last data filed at 05/12/2020 1200 Gross per 24 hour  Intake 981.23 ml  Output 3500 ml  Net -2518.77 ml   Filed Weights   05/11/20 0813 05/11/20 1800  Weight: (!) 206.8 kg (!) 209.1 kg    Examination:  General exam: Appears calm and comfortable, morbidly obese Respiratory system: Coarse breath sounds bilateral decreased at bases with some mild crackles, no wheezing, normal respiratory effort, on 12 L high  flow nasal cannula Cardiovascular system: S1 & S2 heard, RRR. No JVD, murmurs, rubs, gallops or clicks. No pedal edema. Gastrointestinal system: Abdomen is nondistended, soft and nontender. No organomegaly or masses felt. Normal bowel sounds heard. Central nervous system: Alert and oriented. No focal neurological deficits. Extremities: Symmetric 5 x 5 power. Skin: No rashes, lesions or ulcers Psychiatry: Judgement and insight appear poor. Mood & affect appropriate.     Data Reviewed: I have personally reviewed following labs and imaging studies  CBC: Recent Labs  Lab 05/11/20 0823 05/12/20 0554  WBC 10.4 9.0  HGB 12.9 13.2  HCT 43.4 45.0  MCV 78.5* 78.4*  PLT 283 303   Basic Metabolic Panel: Recent Labs  Lab 05/11/20 0823 05/12/20 0554  NA 141 142  K 4.9 4.4  CL 103 96*  CO2 28 36*  GLUCOSE 172* 171*  BUN 17 15  CREATININE 0.96  0.96  CALCIUM 9.1 9.4  MG  --  1.9  PHOS  --  4.3   GFR: Estimated Creatinine Clearance: 114 mL/min (by C-G formula based on SCr of 0.96 mg/dL). Liver Function Tests: Recent Labs  Lab 05/11/20 0823  AST 56*  ALT 49*  ALKPHOS 110  BILITOT 1.1  PROT 7.9  ALBUMIN 3.9   No results for input(s): LIPASE, AMYLASE in the last 168 hours. No results for input(s): AMMONIA in the last 168 hours. Coagulation Profile: No results for input(s): INR, PROTIME in the last 168 hours. Cardiac Enzymes: No results for input(s): CKTOTAL, CKMB, CKMBINDEX, TROPONINI in the last 168 hours. BNP (last 3 results) No results for input(s): PROBNP in the last 8760 hours. HbA1C: No results for input(s): HGBA1C in the last 72 hours. CBG: Recent Labs  Lab 05/11/20 1840 05/12/20 0114 05/12/20 0738 05/12/20 1123  GLUCAP 128* 151* 179* 171*   Lipid Profile: No results for input(s): CHOL, HDL, LDLCALC, TRIG, CHOLHDL, LDLDIRECT in the last 72 hours. Thyroid Function Tests: Recent Labs    05/11/20 0823  TSH 3.343  FREET4 0.91   Anemia Panel: No results for input(s): VITAMINB12, FOLATE, FERRITIN, TIBC, IRON, RETICCTPCT in the last 72 hours. Sepsis Labs: No results for input(s): PROCALCITON, LATICACIDVEN in the last 168 hours.  Recent Results (from the past 240 hour(s))  SARS Coronavirus 2 by RT PCR (hospital order, performed in [facility] hospital lab) Nasopharyngeal Nasopharyngeal Swab     Status: None   Collection Time: 05/11/20  8:52 AM   Specimen: Nasopharyngeal Swab  Result Value Ref Range Status   SARS Coronavirus 2 NEGATIVE NEGATIVE Final    Comment: (NOTE) SARS-CoV-2 target nucleic acids are NOT DETECTED.  The SARS-CoV-2 RNA is generally detectable in upper and lower respiratory specimens during the acute phase of infection. The lowest concentration of SARS-CoV-2 viral copies this assay can detect is 250 copies / mL. A negative result does not preclude SARS-CoV-2 infection and should  not be used as the sole basis for treatment or other patient management decisions.  A negative result may occur with improper specimen collection / handling, submission of specimen other than nasopharyngeal swab, presence of viral mutation(s) within the areas targeted by this assay, and inadequate number of viral copies (<250 copies / mL). A negative result must be combined with clinical observations, patient history, and epidemiological information.  Fact Sheet for Patients:   BoilerBrush.com.cy  Fact Sheet for Healthcare Providers: https://pope.com/  This test is not yet approved or  cleared by the [place] FDA and has been authorized for detection and/or  diagnosis of SARS-CoV-2 by FDA under an Emergency Use Authorization (EUA).  This EUA will remain in effect (meaning this test can be used) for the duration of the COVID-19 declaration under Section 564(b)(1) of the Act, 21 U.S.C. section 360bbb-3(b)(1), unless the authorization is terminated or revoked sooner.  Performed at [facility], [address] [place], [place] [address]   MRSA PCR Screening     Status: None   Collection Time: 05/11/20  7:50 PM   Specimen: Nasopharyngeal  Result Value Ref Range Status   MRSA by PCR NEGATIVE NEGATIVE Final    Comment:        The GeneXpert MRSA Assay (FDA approved for NASAL specimens only), is one component of a comprehensive MRSA colonization surveillance program. It is not intended to diagnose MRSA infection nor to guide or monitor treatment for MRSA infections. Performed at [facility], [address], [place], [place] [address]          Radiology Studies: [facility] Chest [place] 1 View  Result Date: 05/12/2020 CLINICAL DATA:  Acute respiratory failure. EXAM: PORTABLE CHEST 1 VIEW COMPARISON:  05/11/2020 FINDINGS: Cardiac enlargement and pulmonary vascular congestion is unchanged from previous exam. No  pleural effusion or airspace consolidation. Platelike atelectasis in the left lower lobe appears unchanged. Visualized osseous structures are unremarkable. IMPRESSION: Stable cardiac enlargement and pulmonary vascular congestion. Electronically Signed   By: Signa Kell M.D.   On: 05/12/2020 08:14   DG Chest Portable 1 View  Result Date: 05/11/2020 CLINICAL DATA:  Shortness of breath beginning last night. EXAM: PORTABLE CHEST 1 VIEW COMPARISON:  None. FINDINGS: Lordotic technique is demonstrated. Lungs are adequately inflated demonstrate hazy prominence of the perihilar markings suggesting a degree of vascular congestion. No evidence of effusion. Minimal linear density over the lateral left midlung likely atelectasis. Cardiomegaly. Prominent overlying soft tissues. IMPRESSION: Mild cardiomegaly and findings suggesting mild interstitial edema. Minimal linear atelectasis left midlung. Electronically Signed   By: Elberta Fortis M.D.   On: 05/11/2020 09:03        Scheduled Meds: . Chlorhexidine Gluconate Cloth  6 each Topical Daily  . diltiazem  120 mg Oral Daily  . furosemide  80 mg Intravenous Q12H  . insulin aspart  0-15 Units Subcutaneous TID WC  . ipratropium-albuterol  3 mL Nebulization Q6H  . levothyroxine  150 mcg Oral QAC breakfast  . mouth rinse  15 mL Mouth Rinse BID  . methylPREDNISolone (SOLU-MEDROL) injection  40 mg Intravenous Q6H  . potassium chloride  10 mEq Oral Daily  . rivaroxaban  20 mg Oral Q breakfast  . senna-docusate  1 tablet Oral BID  . sodium chloride flush  3 mL Intravenous Q12H  . venlafaxine XR  150 mg Oral Q breakfast   Continuous Infusions: . sodium chloride    . azithromycin Stopped (05/11/20 2109)     LOS: 1 day    Time spent: 39 minutes spent on chart review, discussion with nursing staff, consultants, updating family and interview/physical exam; more than 50% of that time was spent in counseling and/or coordination of care.    Alvira Philips [place],  DO [facility] Available via Epic secure chat 7am-7pm After these hours, please refer to coverage provider listed on [email or web address] 05/12/2020, 3:01 PM

## 2020-05-12 NOTE — Progress Notes (Signed)
ANTICOAGULATION CONSULT NOTE  Pharmacy Consult for Rivaroxaban Indication: atrial fibrillation  No Known Allergies  Patient Measurements: Height: 5' 2.5" (158.8 cm) Weight: (!) 209.1 kg (460 lb 15.7 oz) IBW/kg (Calculated) : 51.25  Vital Signs: Temp: 98.1 F (36.7 C) (07/25 1200) Temp Source: Axillary (07/25 1200) BP: 131/84 (07/25 1400) Pulse Rate: 91 (07/25 1400)  Labs: Recent Labs    05/11/20 0823 05/11/20 1107 05/12/20 0554  HGB 12.9  --  13.2  HCT 43.4  --  45.0  PLT 283  --  303  CREATININE 0.96  --  0.96  TROPONINIHS 11 11  --     Estimated Creatinine Clearance: 114 mL/min (by C-G formula based on SCr of 0.96 mg/dL).   Medical History: Past Medical History:  Diagnosis Date  . Atrial flutter (HCC)   . Depression   . Hypothyroidism   . Iron deficiency   . OSA (obstructive sleep apnea)   . Vitamin B12 deficiency     Assessment: 59 yo female on rivaroxaban PTA for AFib consulted to continue dosing. Last dose 7/24 at 0700.  Goal of Therapy:  Monitor platelets by anticoagulation protocol: Yes   Plan:  Continue home dose of rivaroxaban 20 mg PO daily with meal. Per med rec, pt took dose this AM so will time next dose for tomorrow with breakfast.  SCr and CBC at least every three days per policy  Pharmacy will continue to follow.   Terrika Zuver A 05/12/2020,4:13 PM

## 2020-05-12 NOTE — Evaluation (Signed)
Occupational Therapy Evaluation Patient Details Name: Alexandra Black MRN: [id number] DOB: May 27, 1961 Today's Date: 05/12/2020    History of Present Illness Alexandra Black is a 59 y/o female who was admitted for acute respiratory failure with hypoxia. Chief complaints included hemoptysis, increasing SOB, and dysuria. PMH includes morbid obesity (BMI greater tahn 70), COPD/chronic bronchitis, OSA, probable congestive heart failure, SOB at baseline, A-flutter, depression, hypothyroidism, iron deficiency, and vitamin B12 deficiency.   Clinical Impression   Alexandra Black was seen for OT evaluation this date. Prior to hospital admission, pt was MOD I for mobility using RW and required assist for LB access/pericare. Pt lives alone c caregivers PRN. Pt presents to acute OT demonstrating impaired ADL performance and functional mobility 2/2 decreased activity tolerance and functional strength/ROM/balance deficits. Pt deferred OOB/EOB mobility this date. Pt currently requires MAX A for LBD at bed level. MOD I for UBD at bed level. MAX A x2 for L+R rolling at bed level for simulated pericare. Pt would benefit from skilled OT to address noted impairments and functional limitations (see below for any additional details) in order to maximize safety and independence while minimizing falls risk and caregiver burden. Upon hospital discharge, recommend STR to maximize pt safety and return to PLOF.     Follow Up Recommendations  SNF    Equipment Recommendations   (TBD)    Recommendations for Other Services       Precautions / Restrictions Precautions Precautions: Fall Restrictions Weight Bearing Restrictions: No      Mobility Bed Mobility Overal bed mobility: Needs Assistance Bed Mobility: Rolling Rolling: Max assist;+2 for physical assistance         General bed mobility comments: MAX A x2 to achieve full sidelying position requiring removal of rails - increased assist needed w/o use of rails  and for safety  Transfers                 General transfer comment: Pt deferred OOB or upright attempts    Balance Overall balance assessment:  (not attempted)                                         ADL either performed or assessed with clinical judgement   ADL Overall ADL's : Needs assistance/impaired                                       General ADL Comments: MAX A for LBD at bed level. MOD I for UBD at bed level. MAX A x2 for L+R rolling at bed level for pericare      Vision         Perception     Praxis      Pertinent Vitals/Pain Pain Assessment: Faces Faces Pain Scale: Hurts little more Pain Location: LLE Pain Descriptors / Indicators: Grimacing;Discomfort;Aching Pain Intervention(s): Monitored during session;Repositioned     Hand Dominance     Extremity/Trunk Assessment Upper Extremity Assessment Upper Extremity Assessment: Generalized weakness   Lower Extremity Assessment Lower Extremity Assessment: Generalized weakness       Communication Communication Communication: No difficulties   Cognition Arousal/Alertness: Awake/alert Behavior During Therapy: WFL for tasks assessed/performed;Anxious Overall Cognitive Status: Within Functional Limits for tasks assessed  General Comments: Pt A&O x 4.   General Comments       Exercises Exercises: Other exercises Other Exercises Other Exercises: Pt and family educated re: OT role, DME recs, importance of mobility for functional strengthening   Shoulder Instructions      Home Living Family/patient expects to be discharged to:: Private residence Living Arrangements: Alone Available Help at Discharge: Family;Available PRN/intermittently;Personal care attendant Type of Home: Apartment (condo) Home Access: Other (comment) (1 curb to go up to level condo)     Home Layout: One level     Bathroom Shower/Tub: Tub/shower  unit;Walk-in shower   Bathroom Toilet: Standard     Home Equipment: Environmental consultant - 2 wheels;Walker - 4 wheels;Cane - single point;Shower seat - built in   Additional Comments: Pt reports that her walk in shower has a seat but she doesn't feel safe using it (too low). She also reports that she attempted to order a 3-in-1 but her insurance would not cover it.      Prior Functioning/Environment Level of Independence: Needs assistance  Gait / Transfers Assistance Needed: Pt reports use of RW for ambulation and states that she ambulates household distances. Pt also reports mod I with transfers from bed stating that she rolls onto her stomach and pushes up with her arms to get up.  ADL's / Homemaking Assistance Needed: Pt and daughter in room report MOD I for UB bathing/grooming, assist from family/aids for LB bathing and pericare. Pt reporting that she often uses the bathroom (urinates and deficate) on herself in her chair, her bed, and on the floor in her condo.           OT Problem List: Decreased strength;Decreased range of motion;Decreased activity tolerance;Impaired balance (sitting and/or standing);Decreased knowledge of use of DME or AE      OT Treatment/Interventions: Self-care/ADL training;Therapeutic exercise;Energy conservation;DME and/or AE instruction;Therapeutic activities;Patient/family education;Balance training    OT Goals(Current goals can be found in the care plan section) Acute Rehab OT Goals Patient Stated Goal: to get better OT Goal Formulation: With patient/family Time For Goal Achievement: 05/26/20 Potential to Achieve Goals: Fair ADL Goals Pt Will Perform Grooming: sitting;with supervision Pt Will Transfer to Toilet: with supervision (rolling at bed level) Additional ADL Goal #1: Pt will independently verbalize plan to implement x3 ECS  OT Frequency: Min 1X/week   Barriers to D/C: Inaccessible home environment;Decreased caregiver support          Co-evaluation               AM-PAC OT "6 Clicks" Daily Activity     Outcome Measure Help from another person eating meals?: None Help from another person taking care of personal grooming?: A Little Help from another person toileting, which includes using toliet, bedpan, or urinal?: Total Help from another person bathing (including washing, rinsing, drying)?: A Lot Help from another person to put on and taking off regular upper body clothing?: A Little Help from another person to put on and taking off regular lower body clothing?: A Lot 6 Click Score: 15   End of Session    Activity Tolerance: Patient limited by fatigue;Patient tolerated treatment well Patient left: in bed;with call bell/phone within reach;with nursing/sitter in room  OT Visit Diagnosis: Unsteadiness on feet (R26.81);Other abnormalities of gait and mobility (R26.89)                Time: 1751-0258 OT Time Calculation (min): 18 min Charges:  OT General Charges $OT Visit: 1 Visit OT Evaluation $  OT Eval Moderate Complexity: 1 Mod OT Treatments $Self Care/Home Management : 8-22 mins  Kathie Dike, M.S. OTR/L  05/12/20, 4:27 PM

## 2020-05-13 ENCOUNTER — Telehealth: Payer: Self-pay | Admitting: Cardiology

## 2020-05-13 DIAGNOSIS — J9601 Acute respiratory failure with hypoxia: Secondary | ICD-10-CM | POA: Diagnosis not present

## 2020-05-13 LAB — ECHOCARDIOGRAM COMPLETE
AR max vel: 2.21 cm2
AV Area VTI: 2.56 cm2
AV Area mean vel: 1.95 cm2
AV Mean grad: 4 mmHg
AV Peak grad: 8.4 mmHg
Ao pk vel: 1.45 m/s
Area-P 1/2: 3.6 cm2
Height: 62.5 in
S' Lateral: 3.17 cm
Weight: 7375.71 oz

## 2020-05-13 LAB — CBC
HCT: 40.9 % (ref 36.0–46.0)
Hemoglobin: 12.8 g/dL (ref 12.0–15.0)
MCH: 23.4 pg — ABNORMAL LOW (ref 26.0–34.0)
MCHC: 31.3 g/dL (ref 30.0–36.0)
MCV: 74.8 fL — ABNORMAL LOW (ref 80.0–100.0)
Platelets: 307 10*3/uL (ref 150–400)
RBC: 5.47 MIL/uL — ABNORMAL HIGH (ref 3.87–5.11)
RDW: 14.9 % (ref 11.5–15.5)
WBC: 13.3 10*3/uL — ABNORMAL HIGH (ref 4.0–10.5)
nRBC: 0 % (ref 0.0–0.2)

## 2020-05-13 LAB — BASIC METABOLIC PANEL
Anion gap: 13 (ref 5–15)
BUN: 31 mg/dL — ABNORMAL HIGH (ref 6–20)
CO2: 34 mmol/L — ABNORMAL HIGH (ref 22–32)
Calcium: 8.7 mg/dL — ABNORMAL LOW (ref 8.9–10.3)
Chloride: 94 mmol/L — ABNORMAL LOW (ref 98–111)
Creatinine, Ser: 1.09 mg/dL — ABNORMAL HIGH (ref 0.44–1.00)
GFR calc Af Amer: >60 mL/min (ref >60)
GFR calc non Af Amer: 56 mL/min — ABNORMAL LOW (ref >60)
Glucose, Bld: 197 mg/dL — ABNORMAL HIGH (ref 70–99)
Potassium: 4.5 mmol/L (ref 3.5–5.1)
Sodium: 141 mmol/L (ref 135–145)

## 2020-05-13 LAB — MAGNESIUM: Magnesium: 2.4 mg/dL (ref 1.7–2.4)

## 2020-05-13 LAB — GLUCOSE, CAPILLARY
Glucose-Capillary: 172 mg/dL — ABNORMAL HIGH (ref 70–99)
Glucose-Capillary: 238 mg/dL — ABNORMAL HIGH (ref 70–99)
Glucose-Capillary: 168 mg/dL — ABNORMAL HIGH (ref 70–99)
Glucose-Capillary: 240 mg/dL — ABNORMAL HIGH (ref 70–99)

## 2020-05-13 MED ORDER — DIPHENHYDRAMINE HCL 25 MG PO CAPS
25.0000 mg | ORAL_CAPSULE | Freq: Four times a day (QID) | ORAL | Status: DC | PRN
  Administered 2020-05-13 – 2020-05-17 (×5): 25 mg via ORAL
  Filled 2020-05-13 (×5): qty 1

## 2020-05-13 MED ORDER — METHYLPREDNISOLONE SODIUM SUCC 40 MG IJ SOLR
40.0000 mg | Freq: Three times a day (TID) | INTRAMUSCULAR | Status: DC
  Administered 2020-05-13 – 2020-05-16 (×9): 40 mg via INTRAVENOUS
  Filled 2020-05-13 (×10): qty 1

## 2020-05-13 MED ORDER — FUROSEMIDE 10 MG/ML IJ SOLN
40.0000 mg | Freq: Two times a day (BID) | INTRAMUSCULAR | Status: DC
  Administered 2020-05-13 – 2020-05-17 (×9): 40 mg via INTRAVENOUS
  Filled 2020-05-13 (×9): qty 4

## 2020-05-13 NOTE — TOC Initial Note (Signed)
Transition of Care [facility]) - Initial/Assessment Note    Patient Details  Name: Alexandra Black MRN: [id number] Date of Birth: Oct 12, 1961  Transition of Care [facility]) CM/SW Contact:    Allayne Butcher, RN Phone Number: 05/13/2020, 12:06 PM  Clinical Narrative:                 Patient admitted to the hospital with COPD exacerbation.  Patient reports that she is from home, where she lives in a condo by herself.  Patient reports that she drives.  She is current with her PCP at [facility].  PT is recommending SNF for rehab.  Patient reports that she wants to go home but will agree to go for short term rehab to get stronger.  Patient reports that she needs a bedside commode and wider walker, she would like help with weight loss, depression, and quitting smoking.  Patient has reached out to her PCP Rexene Agent and they will get her set up with an appointment as soon as she discharges from the hospital to address these issues.  Patient has OSA and reports using a bipap at home but she needs a new machine.  RNCM will discuss this with adapt and the hospitalist, patient reports that Dr. Mayo Ao, her pulmonologist, said he would start working on getting her a new machine.    Patient is currently on HFNC at 10 L, she is not on oxygen at home.   This RNCM will begin SNF workup and send out bed requests.   Expected Discharge Plan: Skilled Nursing Facility Barriers to Discharge: Continued Medical Work up   Patient Goals and CMS Choice Patient states their goals for this hospitalization and ongoing recovery are:: Patient wants help at home with DME, mental health, weight loss, smoking cessation and affordable home care. CMS Medicare.gov Compare Post Acute Care list provided to:: Patient Choice offered to / list presented to : Patient  Expected Discharge Plan and Services Expected Discharge Plan: Skilled Nursing Facility   Discharge Planning Services: CM Consult Post Acute Care Choice:  Skilled Nursing Facility Living arrangements for the past 2 months: Apartment                           HH Arranged: NA          Prior Living Arrangements/Services Living arrangements for the past 2 months: Apartment Lives with:: Self Patient language and need for interpreter reviewed:: Yes Do you feel safe going back to the place where you live?: Yes      Need for Family Participation in Patient Care: Yes (Comment) (respiratory failure with hypoxia) Care giver support system in place?: Yes (comment) (sister) Current home services: DME (walker) Criminal Activity/Legal Involvement Pertinent to Current Situation/Hospitalization: No - Comment as needed  Activities of Daily Living Home Assistive Devices/Equipment: Walker (specify type) ADL Screening (condition at time of admission) Patient's cognitive ability adequate to safely complete daily activities?: Yes Is the patient deaf or have difficulty hearing?: No Does the patient have difficulty seeing, even when wearing glasses/contacts?: No Does the patient have difficulty concentrating, remembering, or making decisions?: No Patient able to express need for assistance with ADLs?: Yes Does the patient have difficulty dressing or bathing?: Yes Independently performs ADLs?: No Communication: Independent Dressing (OT): Needs assistance Is this a change from baseline?: Pre-admission baseline Grooming: Needs assistance Is this a change from baseline?: Pre-admission baseline Feeding: Independent Bathing: Needs assistance Is this a change from baseline?: Pre-admission  baseline Toileting: Independent In/Out Bed: Independent with device (comment) Walks in Home: Independent with device (comment) Does the patient have difficulty walking or climbing stairs?: Yes Weakness of Legs: Both Weakness of Arms/Hands: None  Permission Sought/Granted Permission sought to share information with : Case Manager, Passenger transport manager granted to share information with : Yes, Verbal Permission Granted     Permission granted to share info w AGENCY: SNF's        Emotional Assessment Appearance:: Appears older than stated age Attitude/Demeanor/Rapport: Engaged Affect (typically observed): Accepting Orientation: : Oriented to Self, Oriented to Place, Oriented to  Time, Oriented to Situation Alcohol / Substance Use: Tobacco Use Psych Involvement: No (comment)  Admission diagnosis:  Acute pulmonary edema (HCC) [J81.0] Hypoxia [R09.02] Acute respiratory failure with hypoxia (HCC) [J96.01] Patient Active Problem List   Diagnosis Date Noted  . Acute respiratory failure with hypoxia (HCC) 05/11/2020  . OSA (obstructive sleep apnea)   . Atrial flutter (HCC)   . Depression   . Hypothyroidism   . COPD (chronic obstructive pulmonary disease) (HCC)   . Morbid obesity with BMI of 70 and over, adult (HCC)   . GERD (gastroesophageal reflux disease)   . Type 2 diabetes mellitus without complication (HCC)    PCP:  Franciso Bend, NP Pharmacy:   [id number] [place], [place] - [address] [place] [address] Phone: [phone] Fax: [phone]     Social Determinants of Health (SDOH) Interventions    Readmission Risk Interventions No flowsheet data found.

## 2020-05-13 NOTE — Progress Notes (Addendum)
PROGRESS NOTE    Alexandra Black  DXI:[id number] DOB: 11/09/1960 DOA: 05/11/2020 PCP: Franciso Bend, NP    Brief Narrative:  Alexandra Black is an 59 y.o. female with past medical history significant for morbid obesity BMI greater than 70, COPD/chronic bronchitis, OSA, DM 2, probable congestive heart failure, who recently moved to this area from [place] and has had difficulty getting her CPAP mask and medications filled since her move 3 months ago.  Patient was in her usual state of health until 2 to 3 days ago when she complained of new hemoptysis and increasing shortness of breath as well as dysuria.  Per patient's sister who is her power of attorney and her caregiver, patient become short of breath with speaking and with eating.  She is however able to walk across the room and to the kitchen using a walker at baseline.  Since her move patient has had decreasing functional capacity.  Also of note patient is supposed to have a home health aide to help with her personal hygiene and apparently the home health aide has not been coming so patient has been incontinent of urine and sitting in her urine.  Patient sister advised her to come to the ED when she complained of dysuria and increasing shortness of breath yesterday.  Patient sister and daughter do not think that patient has had fevers.  Patient's daughter did see the hemoptysis and thought that the phlegm was faintly pink.  She herself attributed it to patient's coughing.  Of note patient is on Xarelto however it is unclear whether or not patient has been taking the Xarelto as prescribed as patient usually manages her own medications and it is unclear whether she has been getting them filled.  Patient's daughter states that she has not been taking her "water pills".  Of note patient had been admitted to a hospital in [place] 1 year ago with similar symptoms and was possibly diagnosed with congestive heart failure but it is unclear.   Patient has never had a heart attack as far as her sister and daughter know.  The patient was noted to be hypoxic with O2 saturations in the mid 70s on room air.  Chest x-ray done showed interstitial infiltrates consistent with pulmonary edema.  She was given 60 mg Lasix IV and has put out 300 cc so far. She was initially started on CPAP however continued to desaturate and was transitioned to BiPAP.  Assessment & Plan:   Principal Problem:   Acute respiratory failure with hypoxia (HCC) Active Problems:   OSA (obstructive sleep apnea)   Atrial flutter (HCC)   Depression   Hypothyroidism   COPD (chronic obstructive pulmonary disease) (HCC)   Morbid obesity with BMI of 70 and over, adult (HCC)   GERD (gastroesophageal reflux disease)   Type 2 diabetes mellitus without complication (HCC)   Acute on chronic hypercapnic respiratory failure Patient presenting with confusion.  Found to have elevated PaCO2 of 81.  Has been noncompliant with her home BiPAP versus CPAP secondary to issues with her "mask".  This is complicated by her severe morbid obesity with a BMI of 83 with likely underlying obesity hypoventilation syndrome/obstructive sleep apnea. --See treatment below  Acute COPD exacerbation --Continue Solu-Medrol 40 mg IV every 8 hours --Duo nebs every 6 hours --Continue supplemental oxygen, maintain SPO2 greater than 88%, currently on 9 L high flow nasal cannula  Obstructive sleep apnea Obesity hypoventilation syndrome BMI 83.97.  Previously on BiPAP/CPAP at home but issues with device. --  Continue BiPAP during the day while sleeping and nightly. --PCCM following, appreciate assistance --Transition of care for equipment issues  Acute on chronic diastolic congestive heart failure Recently moved to the area from [place].  No previous TTE available for review in the EMR.  Was previously was prescribed furosemide 40 mg p.o. daily, apparently not taking.  TTE with EF 55-60%, no LV  regional wall motion abnormalities, mild LVH, grade 1 diastolic dysfunction. --Net negative 1.9L past 24h and 4.7L since admission --continue furosemide 40 mg IV twice daily --Continue supplemental oxygen as above --Monitor renal function closely daily with aggressive IV diuresis  Hypothyroidism --Continue levothyroxine 150 mcg p.o. daily  Paroxysmal atrial fibrillation --Continue Cardizem 120 mg p.o. daily --Continue anticoagulation with Xarelto --Monitor on telemetry  Type 2 diabetes mellitus Reportedly not taking any medications at home.  Hemoglobin A1c 6.5. --Insulin sliding scale for coverage --CBGs before every meal/at bedtime  Depression --Continue venlafaxine 150 mg p.o. daily  Severe morbid obesity Body mass index is 82.97 kg/m.  Patient previously underwent weight loss surgery with apparent banding at [facility] in [place].  Previously follows with bariatric surgery but has been lost to follow-up since 2018; in which she has had significant weight gain since.  Discussed with patient need for aggressive weight loss measures and lifestyle changes as this complicates all facets of care.  Would benefit from reengagement with bariatric surgery on discharge.   DVT prophylaxis: Xarelto Code Status: Full code Family Communication: No family present at bedside  Disposition Plan:  Status is: Inpatient  Remains inpatient appropriate because:Altered mental status and Unsafe d/c plan   Dispo: The patient is from: Home              Anticipated d/c is to: To be determined              Anticipated d/c date is: 3 days              Patient currently is not medically stable to d/c.  Consultants:   PCCM  Procedures:  TTE: IMPRESSIONS    1. Left ventricular ejection fraction, by estimation, is 55 to 60%. The  left ventricle has normal function. The left ventricle has no regional  wall motion abnormalities. There is mild left ventricular hypertrophy.  Left ventricular  diastolic parameters  are consistent with Grade I diastolic dysfunction (impaired relaxation).  2. Right ventricular systolic function is normal. The right ventricular  size is normal. There is mildly elevated pulmonary artery systolic  pressure.  3. The mitral valve is normal in structure. Trivial mitral valve  regurgitation. No evidence of mitral stenosis.  4. The aortic valve is normal in structure. Aortic valve regurgitation is  not visualized. No aortic stenosis is present.   Antimicrobials:   None   Subjective: Patient seen and examined bedside, resting comfortably.  States she notified her PCP, pulmonary doctor in a.m. primary cardiologist about her current admission.  States slept well last night on BiPAP.  States her pulmonology office is trying to her assist her with her defective CPAP mask.  Continues on high flow nasal cannula, now down to 9 L/min. No other specific complaints at this time.  No family present at bedside.  Denies headache, no chest pain, no palpitations, no abdominal pain, no cough/congestion.  No acute events overnight per nursing staff.  Objective: Vitals:   05/13/20 0430 05/13/20 0749 05/13/20 0821 05/13/20 1156  BP: (!) 129/80  (!) 145/80 115/77  Pulse: 80  74 88  Resp: 18  18 19   Temp: 97.6 F (36.4 C)   98.2 F (36.8 C)  TempSrc: Axillary     SpO2: 99% 100% 96% 98%  Weight:      Height:        Intake/Output Summary (Last 24 hours) at 05/13/2020 1228 Last data filed at 05/13/2020 0500 Gross per 24 hour  Intake 250 ml  Output 2475 ml  Net -2225 ml   Filed Weights   05/11/20 0813 05/11/20 1800  Weight: (!) 206.8 kg (!) 209.1 kg    Examination:  General exam: Appears calm and comfortable, morbidly obese Respiratory system: Coarse breath sounds bilateral decreased at bases with some mild crackles, no wheezing, normal respiratory effort, on 9 L high flow nasal cannula Cardiovascular system: S1 & S2 heard, RRR. No JVD, murmurs, rubs,  gallops or clicks. No pedal edema. Gastrointestinal system: Abdomen is nondistended, soft and nontender. No organomegaly or masses felt. Normal bowel sounds heard. Central nervous system: Alert and oriented. No focal neurological deficits. Extremities: Symmetric 5 x 5 power. Skin: No rashes, lesions or ulcers Psychiatry: Judgement and insight appear poor. Mood & affect appropriate.     Data Reviewed: I have personally reviewed following labs and imaging studies  CBC: Recent Labs  Lab 05/11/20 0823 05/12/20 0554 05/13/20 0422  WBC 10.4 9.0 13.3*  HGB 12.9 13.2 12.8  HCT 43.4 45.0 40.9  MCV 78.5* 78.4* 74.8*  PLT 283 303 307   Basic Metabolic Panel: Recent Labs  Lab 05/11/20 0823 05/12/20 0554 05/13/20 0422  NA 141 142 141  K 4.9 4.4 4.5  CL 103 96* 94*  CO2 28 36* 34*  GLUCOSE 172* 171* 197*  BUN 17 15 31*  CREATININE 0.96 0.96 1.09*  CALCIUM 9.1 9.4 8.7*  MG  --  1.9 2.4  PHOS  --  4.3  --    GFR: Estimated Creatinine Clearance: 100.4 mL/min (A) (by C-G formula based on SCr of 1.09 mg/dL (H)). Liver Function Tests: Recent Labs  Lab 05/11/20 0823  AST 56*  ALT 49*  ALKPHOS 110  BILITOT 1.1  PROT 7.9  ALBUMIN 3.9   No results for input(s): LIPASE, AMYLASE in the last 168 hours. No results for input(s): AMMONIA in the last 168 hours. Coagulation Profile: No results for input(s): INR, PROTIME in the last 168 hours. Cardiac Enzymes: No results for input(s): CKTOTAL, CKMB, CKMBINDEX, TROPONINI in the last 168 hours. BNP (last 3 results) No results for input(s): PROBNP in the last 8760 hours. HbA1C: Recent Labs    05/11/20 1904  HGBA1C 6.5*   CBG: Recent Labs  Lab 05/12/20 1123 05/12/20 1639 05/12/20 2119 05/13/20 0819 05/13/20 1159  GLUCAP 171* 169* 188* 172* 238*   Lipid Profile: No results for input(s): CHOL, HDL, LDLCALC, TRIG, CHOLHDL, LDLDIRECT in the last 72 hours. Thyroid Function Tests: Recent Labs    05/11/20 0823  TSH 3.343    FREET4 0.91   Anemia Panel: No results for input(s): VITAMINB12, FOLATE, FERRITIN, TIBC, IRON, RETICCTPCT in the last 72 hours. Sepsis Labs: No results for input(s): PROCALCITON, LATICACIDVEN in the last 168 hours.  Recent Results (from the past 240 hour(s))  SARS Coronavirus 2 by RT PCR (hospital order, performed in [facility] hospital lab) Nasopharyngeal Nasopharyngeal Swab     Status: None   Collection Time: 05/11/20  8:52 AM   Specimen: Nasopharyngeal Swab  Result Value Ref Range Status   SARS Coronavirus 2 NEGATIVE NEGATIVE Final    Comment: (NOTE) SARS-CoV-2  target nucleic acids are NOT DETECTED.  The SARS-CoV-2 RNA is generally detectable in upper and lower respiratory specimens during the acute phase of infection. The lowest concentration of SARS-CoV-2 viral copies this assay can detect is 250 copies / mL. A negative result does not preclude SARS-CoV-2 infection and should not be used as the sole basis for treatment or other patient management decisions.  A negative result may occur with improper specimen collection / handling, submission of specimen other than nasopharyngeal swab, presence of viral mutation(s) within the areas targeted by this assay, and inadequate number of viral copies (<250 copies / mL). A negative result must be combined with clinical observations, patient history, and epidemiological information.  Fact Sheet for Patients:   BoilerBrush.com.cy  Fact Sheet for Healthcare Providers: https://pope.com/  This test is not yet approved or  cleared by the [place] FDA and has been authorized for detection and/or diagnosis of SARS-CoV-2 by FDA under an Emergency Use Authorization (EUA).  This EUA will remain in effect (meaning this test can be used) for the duration of the COVID-19 declaration under Section 564(b)(1) of the Act, 21 U.S.C. section 360bbb-3(b)(1), unless the authorization is terminated  or revoked sooner.  Performed at [facility], [address] [place], [place] [address]   MRSA PCR Screening     Status: None   Collection Time: 05/11/20  7:50 PM   Specimen: Nasopharyngeal  Result Value Ref Range Status   MRSA by PCR NEGATIVE NEGATIVE Final    Comment:        The GeneXpert MRSA Assay (FDA approved for NASAL specimens only), is one component of a comprehensive MRSA colonization surveillance program. It is not intended to diagnose MRSA infection nor to guide or monitor treatment for MRSA infections. Performed at [facility], [address], [place], [place] [address]          Radiology Studies: [facility] Chest Manlius 1 View  Result Date: 05/12/2020 CLINICAL DATA:  Acute respiratory failure. EXAM: PORTABLE CHEST 1 VIEW COMPARISON:  05/11/2020 FINDINGS: Cardiac enlargement and pulmonary vascular congestion is unchanged from previous exam. No pleural effusion or airspace consolidation. Platelike atelectasis in the left lower lobe appears unchanged. Visualized osseous structures are unremarkable. IMPRESSION: Stable cardiac enlargement and pulmonary vascular congestion. Electronically Signed   By: Signa Kell M.D.   On: 05/12/2020 08:14   ECHOCARDIOGRAM COMPLETE  Result Date: 05/13/2020    ECHOCARDIOGRAM REPORT   Patient Name:   Alexandra Black Date of Exam: 05/12/2020 Medical Rec #:  [id number]        Height:       62.5 in Accession #:    [id number]       Weight:       461.0 lb Date of Birth:  [birth year]        BSA:          2.742 m Patient Age:    59 years         BP:           125/92 mmHg Patient Gender: F                HR:           88 bpm. Exam Location:  [facility] Procedure: 2D Echo, Cardiac Doppler and Color Doppler Indications:     Dyspnea 786.09 / R06.00  History:         Patient has no prior history of Echocardiogram examinations.  Arrythmias:Atrial Flutter.  Sonographer:     Neysa Bonito Roar Referring Phys:  [id number] Raynaldo Opitz  CHATTERJEE Diagnosing Phys: Lorine Bears MD IMPRESSIONS  1. Left ventricular ejection fraction, by estimation, is 55 to 60%. The left ventricle has normal function. The left ventricle has no regional wall motion abnormalities. There is mild left ventricular hypertrophy. Left ventricular diastolic parameters are consistent with Grade I diastolic dysfunction (impaired relaxation).  2. Right ventricular systolic function is normal. The right ventricular size is normal. There is mildly elevated pulmonary artery systolic pressure.  3. The mitral valve is normal in structure. Trivial mitral valve regurgitation. No evidence of mitral stenosis.  4. The aortic valve is normal in structure. Aortic valve regurgitation is not visualized. No aortic stenosis is present. FINDINGS  Left Ventricle: Left ventricular ejection fraction, by estimation, is 55 to 60%. The left ventricle has normal function. The left ventricle has no regional wall motion abnormalities. The left ventricular internal cavity size was normal in size. There is  mild left ventricular hypertrophy. Left ventricular diastolic parameters are consistent with Grade I diastolic dysfunction (impaired relaxation). Right Ventricle: The right ventricular size is normal. No increase in right ventricular wall thickness. Right ventricular systolic function is normal. There is mildly elevated pulmonary artery systolic pressure. The tricuspid regurgitant velocity is 3.01  m/s, and with an assumed right atrial pressure of 5 mmHg, the estimated right ventricular systolic pressure is 41.2 mmHg. Left Atrium: Left atrial size was normal in size. Right Atrium: Right atrial size was normal in size. Pericardium: There is no evidence of pericardial effusion. Mitral Valve: The mitral valve is normal in structure. Normal mobility of the mitral valve leaflets. Trivial mitral valve regurgitation. No evidence of mitral valve stenosis. Tricuspid Valve: The tricuspid valve is normal in  structure. Tricuspid valve regurgitation is trivial. No evidence of tricuspid stenosis. Aortic Valve: The aortic valve is normal in structure. Aortic valve regurgitation is not visualized. No aortic stenosis is present. Aortic valve mean gradient measures 4.0 mmHg. Aortic valve peak gradient measures 8.4 mmHg. Aortic valve area, by VTI measures 2.56 cm. Pulmonic Valve: The pulmonic valve was normal in structure. Pulmonic valve regurgitation is not visualized. No evidence of pulmonic stenosis. Aorta: The aortic root is normal in size and structure. Venous: The inferior vena cava was not well visualized. IAS/Shunts: No atrial level shunt detected by color flow Doppler.  LEFT VENTRICLE PLAX 2D LVIDd:         4.44 cm  Diastology LVIDs:         3.17 cm  LV e' lateral:   8.81 cm/s LV PW:         1.21 cm  LV E/e' lateral: 7.7 LV IVS:        1.31 cm  LV e' medial:    7.18 cm/s LVOT diam:     1.80 cm  LV E/e' medial:  9.4 LV SV:         53 LV SV Index:   19 LVOT Area:     2.54 cm  RIGHT VENTRICLE RV Mid diam:    3.50 cm RV S prime:     21.60 cm/s TAPSE (M-mode): 2.1 cm LEFT ATRIUM             Index       RIGHT ATRIUM           Index LA diam:        3.50 cm 1.28 cm/m  RA Area:     12.60  cm LA Vol (A2C):   67.2 ml 24.50 ml/m RA Volume:   27.00 ml  9.85 ml/m LA Vol (A4C):   45.5 ml 16.59 ml/m LA Biplane Vol: 57.9 ml 21.11 ml/m  AORTIC VALVE                   PULMONIC VALVE AV Area (Vmax):    2.21 cm    PV Vmax:        1.15 m/s AV Area (Vmean):   1.95 cm    PV Peak grad:   5.3 mmHg AV Area (VTI):     2.56 cm    RVOT Peak grad: 2 mmHg AV Vmax:           145.00 cm/s AV Vmean:          95.200 cm/s AV VTI:            0.207 m AV Peak Grad:      8.4 mmHg AV Mean Grad:      4.0 mmHg LVOT Vmax:         126.00 cm/s LVOT Vmean:        72.900 cm/s LVOT VTI:          0.208 m LVOT/AV VTI ratio: 1.00  AORTA Ao Root diam: 2.70 cm MITRAL VALVE               TRICUSPID VALVE MV Area (PHT): 3.60 cm    TR Peak grad:   36.2 mmHg MV Decel  Time: 211 msec    TR Vmax:        301.00 cm/s MV E velocity: 67.40 cm/s MV A velocity: 97.50 cm/s  SHUNTS MV E/A ratio:  0.69        Systemic VTI:  0.21 m MV A Prime:    12.1 cm/s   Systemic Diam: 1.80 cm Lorine BearsMuhammad Arida MD Electronically signed by Lorine BearsMuhammad Arida MD Signature Date/Time: 05/13/2020/9:10:29 AM    Final         Scheduled Meds: . Chlorhexidine Gluconate Cloth  6 each Topical Daily  . diltiazem  120 mg Oral Daily  . furosemide  40 mg Intravenous Q12H  . insulin aspart  0-15 Units Subcutaneous TID WC  . ipratropium-albuterol  3 mL Nebulization Q6H  . levothyroxine  150 mcg Oral QAC breakfast  . mouth rinse  15 mL Mouth Rinse BID  . methylPREDNISolone (SOLU-MEDROL) injection  40 mg Intravenous Q6H  . potassium chloride  10 mEq Oral Daily  . rivaroxaban  20 mg Oral Q breakfast  . senna-docusate  1 tablet Oral BID  . sodium chloride flush  3 mL Intravenous Q12H  . venlafaxine XR  150 mg Oral Q breakfast   Continuous Infusions: . sodium chloride    . azithromycin Stopped (05/12/20 2030)     LOS: 2 days    Time spent: 38 minutes spent on chart review, discussion with nursing staff, consultants, updating family and interview/physical exam; more than 50% of that time was spent in counseling and/or coordination of care.    Alvira PhilipsEric J [place], DO [facility] Available via Epic secure chat 7am-7pm After these hours, please refer to coverage provider listed on [email or web address] 05/13/2020, 12:28 PM

## 2020-05-13 NOTE — Progress Notes (Signed)
Patient continues to exhibit signs of hypercapnia associated with morbid obesity that is causing thoracic restriction.  Interruption or failure to provide NIV would quickly lead to exacerbation of the patient's condition, hospital admission, and likely harm to the patient. Continued use is preferred.  The use of the NIV will treat patient's high PC02 levels and can reduce risk of exacerbations and future hospitalizations when used at night and during the day.  BiLevel/RAD has been tried previously and has proven ineffective at managing this patient's hypercapnia.  Ventilation is required to decrease the work of breathing and improve pulmonary status. Interruption of ventilator support would lead to decline of health status.  Patient is able to protect their airways and clear secretions on their own.

## 2020-05-13 NOTE — Telephone Encounter (Signed)
Debbe Odea, MD    Patient admitted with mainly respiratory issues, and hypercapnia. Please advisee patient that if her treatment team needs cardiology evaluation, cardiology will then be consulted and someone from the team/[facility] rounding in the hospital will then evaluate. Thank you     Noted. Patient has already been advised of this.

## 2020-05-13 NOTE — Telephone Encounter (Signed)
Retuned the patients call. Advised the patient that she will need to discuss with the physicians and nurses taking care of her if the need for a Cardiology consult is indicated.  Patient sts that she is a patient of Dr. Merita Norton and wants to make sure that he knows she is currently hospitalized at [facility] and she would like for him to come see her. Advised her that he is seeing patients all day in the office today. I will fwd the FYI update to him as she requested.  Patient voiced appreciation.

## 2020-05-13 NOTE — Telephone Encounter (Signed)
Patient calling  Patient was admitted due to heart and lung issues  Would like to know if Dr Azucena Cecil or one of our providers can come by to see her She is in room 110  Please call

## 2020-05-13 NOTE — [place] FL2 (Signed)
[place] MEDICAID FL2 LEVEL OF CARE SCREENING TOOL     IDENTIFICATION  Patient Name: Alexandra Black Birthdate: 04/04/1961 Sex: female Admission Date (Current Location): 05/11/2020  [place] and [place] Number:  Chiropodist and Address:  [facility], [address] [place], [place] [address]      Provider Number: [id number]  Attending Physician Name and Address:  [place], Eric J, DO  Relative Name and Phone Number:  Orlene Plum (sister) [phone]    Current Level of Care: Hospital Recommended Level of Care: Skilled Nursing Facility Prior Approval Number:    Date Approved/Denied:   PASRR Number: [id number]  Discharge Plan: SNF    Current Diagnoses: Patient Active Problem List   Diagnosis Date Noted  . Acute respiratory failure with hypoxia (HCC) 05/11/2020  . OSA (obstructive sleep apnea)   . Atrial flutter (HCC)   . Depression   . Hypothyroidism   . COPD (chronic obstructive pulmonary disease) (HCC)   . Morbid obesity with BMI of 70 and over, adult (HCC)   . GERD (gastroesophageal reflux disease)   . Type 2 diabetes mellitus without complication (HCC)     Orientation RESPIRATION BLADDER Height & Weight     Self, Time, Situation, Place  O2 Incontinent Weight: (!) 209.1 kg Height:  5' 2.5" (158.8 cm)  BEHAVIORAL SYMPTOMS/MOOD NEUROLOGICAL BOWEL NUTRITION STATUS      Continent Diet (see discharge summary)  AMBULATORY STATUS COMMUNICATION OF NEEDS Skin   Limited Assist Verbally Normal                       Personal Care Assistance Level of Assistance  Bathing, Feeding, Dressing Bathing Assistance: Limited assistance Feeding assistance: Limited assistance Dressing Assistance: Limited assistance     Functional Limitations Info             SPECIAL CARE FACTORS FREQUENCY  PT (By licensed PT), OT (By licensed OT)     PT Frequency: 5 times per week OT Frequency: 5 times per week            Contractures  Contractures Info: Not present    Additional Factors Info  Code Status, Allergies Code Status Info: Full Allergies Info: NKA           Current Medications (05/13/2020):  This is the current hospital active medication list Current Facility-Administered Medications  Medication Dose Route Frequency Provider Last Rate Last Admin  . 0.9 %  sodium chloride infusion  250 mL Intravenous PRN Leandro Reasoner Tublu, MD      . acetaminophen (TYLENOL) tablet 650 mg  650 mg Oral Q6H PRN Pieter Partridge, MD   650 mg at 05/12/20 0810   Or  . acetaminophen (TYLENOL) suppository 650 mg  650 mg Rectal Q6H PRN Leandro Reasoner Tublu, MD      . azithromycin (ZITHROMAX) 500 mg in sodium chloride 0.9 % 250 mL IVPB  500 mg Intravenous Q24H Pieter Partridge, MD   Stopped at 05/12/20 2030  . Chlorhexidine Gluconate Cloth 2 % PADS 6 each  6 each Topical Daily Eugenie Norrie, NP   6 each at 05/13/20 0548  . diltiazem (CARDIZEM) tablet 120 mg  120 mg Oral Daily Leandro Reasoner Tublu, MD   120 mg at 05/13/20 0851  . diphenhydrAMINE (BENADRYL) capsule 25 mg  25 mg Oral Q6H PRN [place], Eric J, DO   25 mg at 05/13/20 1051  . furosemide (LASIX) injection 40 mg  40 mg Intravenous  Q12H [place], Eric J, DO   40 mg at 05/13/20 0854  . guaiFENesin (MUCINEX) 12 hr tablet 600 mg  600 mg Oral BID PRN Pieter Partridge, MD   600 mg at 05/12/20 0810  . insulin aspart (novoLOG) injection 0-15 Units  0-15 Units Subcutaneous TID WC Pieter Partridge, MD   3 Units at 05/13/20 0901  . ipratropium-albuterol (DUONEB) 0.5-2.5 (3) MG/3ML nebulizer solution 3 mL  3 mL Nebulization Q6H Leandro Reasoner Tublu, MD   3 mL at 05/13/20 0749  . levothyroxine (SYNTHROID) tablet 150 mcg  150 mcg Oral QAC breakfast Leandro Reasoner Tublu, MD   150 mcg at 05/13/20 0517  . MEDLINE mouth rinse  15 mL Mouth Rinse BID Vida Rigger, MD   15 mL at 05/12/20 2139  . methylPREDNISolone sodium succinate  (SOLU-MEDROL) 40 mg/mL injection 40 mg  40 mg Intravenous Q6H Leandro Reasoner Tublu, MD   40 mg at 05/13/20 0854  . polyethylene glycol (MIRALAX / GLYCOLAX) packet 17 g  17 g Oral Daily PRN Leandro Reasoner Tublu, MD      . potassium chloride SA (KLOR-CON) CR tablet 10 mEq  10 mEq Oral Daily Leandro Reasoner Tublu, MD   10 mEq at 05/13/20 0853  . rivaroxaban (XARELTO) tablet 20 mg  20 mg Oral Q breakfast Marty Heck, RPH   20 mg at 05/12/20 1248  . senna-docusate (Senokot-S) tablet 1 tablet  1 tablet Oral BID [place], Eric J, DO   1 tablet at 05/13/20 6415  . sodium chloride flush (NS) 0.9 % injection 3 mL  3 mL Intravenous Q12H Leandro Reasoner Tublu, MD   3 mL at 05/13/20 0902  . sodium chloride flush (NS) 0.9 % injection 3 mL  3 mL Intravenous PRN Leandro Reasoner Tublu, MD      . sorbitol 70 % solution 30 mL  30 mL Oral Daily PRN Leandro Reasoner Tublu, MD      . venlafaxine XR (EFFEXOR-XR) 24 hr capsule 150 mg  150 mg Oral Q breakfast Leandro Reasoner Tublu, MD   150 mg at 05/13/20 8309     Discharge Medications: Please see discharge summary for a list of discharge medications.  Relevant Imaging Results:  Relevant Lab Results:   Additional Information ss# [id number]  Allayne Butcher, RN

## 2020-05-14 DIAGNOSIS — J9601 Acute respiratory failure with hypoxia: Secondary | ICD-10-CM | POA: Diagnosis not present

## 2020-05-14 LAB — BASIC METABOLIC PANEL
Anion gap: 11 (ref 5–15)
BUN: 34 mg/dL — ABNORMAL HIGH (ref 6–20)
CO2: 36 mmol/L — ABNORMAL HIGH (ref 22–32)
Calcium: 8.9 mg/dL (ref 8.9–10.3)
Chloride: 92 mmol/L — ABNORMAL LOW (ref 98–111)
Creatinine, Ser: 0.98 mg/dL (ref 0.44–1.00)
GFR calc Af Amer: >60 mL/min (ref >60)
GFR calc non Af Amer: >60 mL/min (ref >60)
Glucose, Bld: 202 mg/dL — ABNORMAL HIGH (ref 70–99)
Potassium: 4.9 mmol/L (ref 3.5–5.1)
Sodium: 139 mmol/L (ref 135–145)

## 2020-05-14 LAB — GLUCOSE, CAPILLARY
Glucose-Capillary: 185 mg/dL — ABNORMAL HIGH (ref 70–99)
Glucose-Capillary: 194 mg/dL — ABNORMAL HIGH (ref 70–99)
Glucose-Capillary: 193 mg/dL — ABNORMAL HIGH (ref 70–99)
Glucose-Capillary: 232 mg/dL — ABNORMAL HIGH (ref 70–99)

## 2020-05-14 MED ORDER — KETOROLAC TROMETHAMINE 30 MG/ML IJ SOLN
15.0000 mg | Freq: Three times a day (TID) | INTRAMUSCULAR | Status: DC | PRN
  Administered 2020-05-14 – 2020-05-17 (×4): 15 mg via INTRAVENOUS
  Filled 2020-05-14 (×4): qty 1

## 2020-05-14 NOTE — Progress Notes (Signed)
PROGRESS NOTE    Alexandra Black  EAV:[id number] DOB: November 10, 1960 DOA: 05/11/2020 PCP: Franciso Bend, NP    Brief Narrative:  Alexandra Black is an 59 y.o. female with past medical history significant for morbid obesity BMI greater than 70, COPD/chronic bronchitis, OSA, DM 2, probable congestive heart failure, who recently moved to this area from [place] and has had difficulty getting her CPAP mask and medications filled since her move 3 months ago.  Patient was in her usual state of health until 2 to 3 days ago when she complained of new hemoptysis and increasing shortness of breath as well as dysuria.  Per patient's sister who is her power of attorney and her caregiver, patient become short of breath with speaking and with eating.  She is however able to walk across the room and to the kitchen using a walker at baseline.  Since her move patient has had decreasing functional capacity.  Also of note patient is supposed to have a home health aide to help with her personal hygiene and apparently the home health aide has not been coming so patient has been incontinent of urine and sitting in her urine.  Patient sister advised her to come to the ED when she complained of dysuria and increasing shortness of breath yesterday.  Patient sister and daughter do not think that patient has had fevers.  Patient's daughter did see the hemoptysis and thought that the phlegm was faintly pink.  She herself attributed it to patient's coughing.  Of note patient is on Xarelto however it is unclear whether or not patient has been taking the Xarelto as prescribed as patient usually manages her own medications and it is unclear whether she has been getting them filled.  Patient's daughter states that she has not been taking her "water pills".  Of note patient had been admitted to a hospital in [place] 1 year ago with similar symptoms and was possibly diagnosed with congestive heart failure but it is unclear.   Patient has never had a heart attack as far as her sister and daughter know.  The patient was noted to be hypoxic with O2 saturations in the mid 70s on room air.  Chest x-ray done showed interstitial infiltrates consistent with pulmonary edema.  She was given 60 mg Lasix IV and has put out 300 cc so far. She was initially started on CPAP however continued to desaturate and was transitioned to BiPAP.  Assessment & Plan:   Principal Problem:   Acute respiratory failure with hypoxia (HCC) Active Problems:   OSA (obstructive sleep apnea)   Atrial flutter (HCC)   Depression   Hypothyroidism   COPD (chronic obstructive pulmonary disease) (HCC)   Morbid obesity with BMI of 70 and over, adult (HCC)   GERD (gastroesophageal reflux disease)   Type 2 diabetes mellitus without complication (HCC)   Acute on chronic hypercapnic respiratory failure Patient presenting with confusion.  Found to have elevated PaCO2 of 81.  Has been noncompliant with her home BiPAP versus CPAP secondary to issues with her "mask".  This is complicated by her severe morbid obesity with a BMI of 83 with likely underlying obesity hypoventilation syndrome/obstructive sleep apnea. --See treatment below, transition of care assisting with obtaining a trilogy ventilator for discharge  Acute COPD exacerbation --Continue Solu-Medrol 40 mg IV every 8 hours --Duo nebs every 6 hours --Continue supplemental oxygen, maintain SPO2 greater than 88%, currently on 9 L high flow nasal cannula  Obstructive sleep apnea Obesity hypoventilation syndrome BMI  83.97.  Previously on BiPAP/CPAP at home but issues with device. --Continue BiPAP during the day while sleeping and nightly. --Transition of care assisting for trilogy vent on discharge  Acute on chronic diastolic congestive heart failure Recently moved to the area from [place].  No previous TTE available for review in the EMR.  Was previously was prescribed furosemide 40 mg p.o.  daily, apparently not taking.  TTE with EF 55-60%, no LV regional wall motion abnormalities, mild LVH, grade 1 diastolic dysfunction. --continue furosemide 40 mg IV twice daily --Continue supplemental oxygen as above --Monitor renal function closely daily with aggressive IV diuresis  Hypothyroidism --Continue levothyroxine 150 mcg p.o. daily  Paroxysmal atrial fibrillation --Continue Cardizem 120 mg p.o. daily --Continue anticoagulation with Xarelto  Type 2 diabetes mellitus Reportedly not taking any medications at home.  Hemoglobin A1c 6.5. --Insulin sliding scale for coverage --CBGs before every meal/at bedtime  Depression --Continue venlafaxine 150 mg p.o. daily  Severe morbid obesity Body mass index is 82.97 kg/m.  Patient previously underwent weight loss surgery with apparent banding at [facility] in [place].  Previously follows with bariatric surgery but has been lost to follow-up since 2018; in which she has had significant weight gain since.  Discussed with patient need for aggressive weight loss measures and lifestyle changes as this complicates all facets of care.  Would benefit from reengagement with bariatric surgery on discharge.  Weakness, deconditioning: PT/OT with recommendations of SNF placement.  Patient in agreement.  Transitions of care for placement.   DVT prophylaxis: Xarelto Code Status: Full code Family Communication: No family present at bedside  Disposition Plan:  Status is: Inpatient  Remains inpatient appropriate because:Altered mental status and Unsafe d/c plan   Dispo: The patient is from: Home              Anticipated d/c is to: SNF              Anticipated d/c date is: 2 days              Patient currently is not medically stable to d/c.  Continues on 7 L nasal cannula, awaiting to obtain trilogy vent  Consultants:   PCCM  Procedures:  TTE: IMPRESSIONS    1. Left ventricular ejection fraction, by estimation, is 55 to 60%. The  left  ventricle has normal function. The left ventricle has no regional  wall motion abnormalities. There is mild left ventricular hypertrophy.  Left ventricular diastolic parameters  are consistent with Grade I diastolic dysfunction (impaired relaxation).  2. Right ventricular systolic function is normal. The right ventricular  size is normal. There is mildly elevated pulmonary artery systolic  pressure.  3. The mitral valve is normal in structure. Trivial mitral valve  regurgitation. No evidence of mitral stenosis.  4. The aortic valve is normal in structure. Aortic valve regurgitation is  not visualized. No aortic stenosis is present.   Antimicrobials:   None   Subjective: Patient seen and examined bedside, resting comfortably.  Breathing improved but not yet back to baseline.  Continues on high FiO2 with 7 L per nasal cannula; not on home O2.  Feels better rested while utilizing CPAP at nighttime; complains of facemask causing headache.  No other complaints or concerns at this time.  No family present at bedside.  Denies headache, no chest pain, no palpitations, no abdominal pain, no cough/congestion.  No acute events overnight per nursing staff.  Objective: Vitals:   05/14/20 1308 05/14/20 0515 05/14/20 0740 05/14/20 6578  BP:  126/81  (!) 135/81  Pulse:  76  69  Resp:  16  17  Temp:  98.1 F (36.7 C)  98.5 F (36.9 C)  TempSrc:      SpO2: 94% 94% 93% 95%  Weight:      Height:        Intake/Output Summary (Last 24 hours) at 05/14/2020 0956 Last data filed at 05/13/2020 1724 Gross per 24 hour  Intake 1900 ml  Output --  Net 1900 ml   Filed Weights   05/11/20 0813 05/11/20 1800  Weight: (!) 206.8 kg (!) 209.1 kg    Examination:  General exam: Appears calm and comfortable, morbidly obese Respiratory system: Coarse breath sounds bilateral decreased at bases with some mild crackles, no wheezing, normal respiratory effort, on 7 L [place] w/ spO2 94% Cardiovascular system: S1 &  S2 heard, RRR. No JVD, murmurs, rubs, gallops or clicks. No pedal edema. Gastrointestinal system: Abdomen is nondistended, soft and nontender. No organomegaly or masses felt. Normal bowel sounds heard. Central nervous system: Alert and oriented. No focal neurological deficits. Extremities: Symmetric 5 x 5 power. Skin: No rashes, lesions or ulcers Psychiatry: Judgement and insight appear poor. Mood & affect appropriate.     Data Reviewed: I have personally reviewed following labs and imaging studies  CBC: Recent Labs  Lab 05/11/20 0823 05/12/20 0554 05/13/20 0422  WBC 10.4 9.0 13.3*  HGB 12.9 13.2 12.8  HCT 43.4 45.0 40.9  MCV 78.5* 78.4* 74.8*  PLT 283 303 307   Basic Metabolic Panel: Recent Labs  Lab 05/11/20 0823 05/12/20 0554 05/13/20 0422 05/14/20 0433  NA 141 142 141 139  K 4.9 4.4 4.5 4.9  CL 103 96* 94* 92*  CO2 28 36* 34* 36*  GLUCOSE 172* 171* 197* 202*  BUN 17 15 31* 34*  CREATININE 0.96 0.96 1.09* 0.98  CALCIUM 9.1 9.4 8.7* 8.9  MG  --  1.9 2.4  --   PHOS  --  4.3  --   --    GFR: Estimated Creatinine Clearance: 111.6 mL/min (by C-G formula based on SCr of 0.98 mg/dL). Liver Function Tests: Recent Labs  Lab 05/11/20 0823  AST 56*  ALT 49*  ALKPHOS 110  BILITOT 1.1  PROT 7.9  ALBUMIN 3.9   No results for input(s): LIPASE, AMYLASE in the last 168 hours. No results for input(s): AMMONIA in the last 168 hours. Coagulation Profile: No results for input(s): INR, PROTIME in the last 168 hours. Cardiac Enzymes: No results for input(s): CKTOTAL, CKMB, CKMBINDEX, TROPONINI in the last 168 hours. BNP (last 3 results) No results for input(s): PROBNP in the last 8760 hours. HbA1C: Recent Labs    05/11/20 1904  HGBA1C 6.5*   CBG: Recent Labs  Lab 05/13/20 0819 05/13/20 1159 05/13/20 1727 05/13/20 2121 05/14/20 0822  GLUCAP 172* 238* 168* 240* 185*   Lipid Profile: No results for input(s): CHOL, HDL, LDLCALC, TRIG, CHOLHDL, LDLDIRECT in the  last 72 hours. Thyroid Function Tests: No results for input(s): TSH, T4TOTAL, FREET4, T3FREE, THYROIDAB in the last 72 hours. Anemia Panel: No results for input(s): VITAMINB12, FOLATE, FERRITIN, TIBC, IRON, RETICCTPCT in the last 72 hours. Sepsis Labs: No results for input(s): PROCALCITON, LATICACIDVEN in the last 168 hours.  Recent Results (from the past 240 hour(s))  SARS Coronavirus 2 by RT PCR (hospital order, [facility]) Nasopharyngeal Nasopharyngeal Swab     Status: None   Collection Time: 05/11/20  8:52 AM  Specimen: Nasopharyngeal Swab  Result Value Ref Range Status   SARS Coronavirus 2 NEGATIVE NEGATIVE Final    Comment: (NOTE) SARS-CoV-2 target nucleic acids are NOT DETECTED.  The SARS-CoV-2 RNA is generally detectable in upper and lower respiratory specimens during the acute phase of infection. The lowest concentration of SARS-CoV-2 viral copies this assay can detect is 250 copies / mL. A negative result does not preclude SARS-CoV-2 infection and should not be used as the sole basis for treatment or other patient management decisions.  A negative result may occur with improper specimen collection / handling, submission of specimen other than nasopharyngeal swab, presence of viral mutation(s) within the areas targeted by this assay, and inadequate number of viral copies (<250 copies / mL). A negative result must be combined with clinical observations, patient history, and epidemiological information.  Fact Sheet for Patients:   BoilerBrush.com.cy  Fact Sheet for Healthcare Providers: https://pope.com/  This test is not yet approved or  cleared by the [place] FDA and has been authorized for detection and/or diagnosis of SARS-CoV-2 by FDA under an Emergency Use Authorization (EUA).  This EUA will remain in effect (meaning this test can be used) for the duration of the COVID-19 declaration  under Section 564(b)(1) of the Act, 21 U.S.C. section 360bbb-3(b)(1), unless the authorization is terminated or revoked sooner.  Performed at [facility], [address] [place], [place] [address]   MRSA PCR Screening     Status: None   Collection Time: 05/11/20  7:50 PM   Specimen: Nasopharyngeal  Result Value Ref Range Status   MRSA by PCR NEGATIVE NEGATIVE Final    Comment:        The GeneXpert MRSA Assay (FDA approved for NASAL specimens only), is one component of a comprehensive MRSA colonization surveillance program. It is not intended to diagnose MRSA infection nor to guide or monitor treatment for MRSA infections. Performed at [facility], [address], [place], [place] [address]          Radiology Studies: ECHOCARDIOGRAM COMPLETE  Result Date: 05/13/2020    ECHOCARDIOGRAM REPORT   Patient Name:   ANICIA LEUTHOLD Date of Exam: 05/12/2020 Medical Rec #:  [id number]        Height:       62.5 in Accession #:    [id number]       Weight:       461.0 lb Date of Birth:  11-20-1960        BSA:          2.742 m Patient Age:    59 years         BP:           125/92 mmHg Patient Gender: F                HR:           88 bpm. Exam Location:  [facility] Procedure: 2D Echo, Cardiac Doppler and Color Doppler Indications:     Dyspnea 786.09 / R06.00  History:         Patient has no prior history of Echocardiogram examinations.                  Arrythmias:Atrial Flutter.  Sonographer:     Neysa Bonito Roar Referring Phys:  [id number] Raynaldo Opitz CHATTERJEE Diagnosing Phys: Lorine Bears MD IMPRESSIONS  1. Left ventricular ejection fraction, by estimation, is 55 to 60%. The left ventricle has normal function. The left ventricle has no  regional wall motion abnormalities. There is mild left ventricular hypertrophy. Left ventricular diastolic parameters are consistent with Grade I diastolic dysfunction (impaired relaxation).  2. Right ventricular systolic function is normal. The  right ventricular size is normal. There is mildly elevated pulmonary artery systolic pressure.  3. The mitral valve is normal in structure. Trivial mitral valve regurgitation. No evidence of mitral stenosis.  4. The aortic valve is normal in structure. Aortic valve regurgitation is not visualized. No aortic stenosis is present. FINDINGS  Left Ventricle: Left ventricular ejection fraction, by estimation, is 55 to 60%. The left ventricle has normal function. The left ventricle has no regional wall motion abnormalities. The left ventricular internal cavity size was normal in size. There is  mild left ventricular hypertrophy. Left ventricular diastolic parameters are consistent with Grade I diastolic dysfunction (impaired relaxation). Right Ventricle: The right ventricular size is normal. No increase in right ventricular wall thickness. Right ventricular systolic function is normal. There is mildly elevated pulmonary artery systolic pressure. The tricuspid regurgitant velocity is 3.01  m/s, and with an assumed right atrial pressure of 5 mmHg, the estimated right ventricular systolic pressure is 41.2 mmHg. Left Atrium: Left atrial size was normal in size. Right Atrium: Right atrial size was normal in size. Pericardium: There is no evidence of pericardial effusion. Mitral Valve: The mitral valve is normal in structure. Normal mobility of the mitral valve leaflets. Trivial mitral valve regurgitation. No evidence of mitral valve stenosis. Tricuspid Valve: The tricuspid valve is normal in structure. Tricuspid valve regurgitation is trivial. No evidence of tricuspid stenosis. Aortic Valve: The aortic valve is normal in structure. Aortic valve regurgitation is not visualized. No aortic stenosis is present. Aortic valve mean gradient measures 4.0 mmHg. Aortic valve peak gradient measures 8.4 mmHg. Aortic valve area, by VTI measures 2.56 cm. Pulmonic Valve: The pulmonic valve was normal in structure. Pulmonic valve  regurgitation is not visualized. No evidence of pulmonic stenosis. Aorta: The aortic root is normal in size and structure. Venous: The inferior vena cava was not well visualized. IAS/Shunts: No atrial level shunt detected by color flow Doppler.  LEFT VENTRICLE PLAX 2D LVIDd:         4.44 cm  Diastology LVIDs:         3.17 cm  LV e' lateral:   8.81 cm/s LV PW:         1.21 cm  LV E/e' lateral: 7.7 LV IVS:        1.31 cm  LV e' medial:    7.18 cm/s LVOT diam:     1.80 cm  LV E/e' medial:  9.4 LV SV:         53 LV SV Index:   19 LVOT Area:     2.54 cm  RIGHT VENTRICLE RV Mid diam:    3.50 cm RV S prime:     21.60 cm/s TAPSE (M-mode): 2.1 cm LEFT ATRIUM             Index       RIGHT ATRIUM           Index LA diam:        3.50 cm 1.28 cm/m  RA Area:     12.60 cm LA Vol (A2C):   67.2 ml 24.50 ml/m RA Volume:   27.00 ml  9.85 ml/m LA Vol (A4C):   45.5 ml 16.59 ml/m LA Biplane Vol: 57.9 ml 21.11 ml/m  AORTIC VALVE  PULMONIC VALVE AV Area (Vmax):    2.21 cm    PV Vmax:        1.15 m/s AV Area (Vmean):   1.95 cm    PV Peak grad:   5.3 mmHg AV Area (VTI):     2.56 cm    RVOT Peak grad: 2 mmHg AV Vmax:           145.00 cm/s AV Vmean:          95.200 cm/s AV VTI:            0.207 m AV Peak Grad:      8.4 mmHg AV Mean Grad:      4.0 mmHg LVOT Vmax:         126.00 cm/s LVOT Vmean:        72.900 cm/s LVOT VTI:          0.208 m LVOT/AV VTI ratio: 1.00  AORTA Ao Root diam: 2.70 cm MITRAL VALVE               TRICUSPID VALVE MV Area (PHT): 3.60 cm    TR Peak grad:   36.2 mmHg MV Decel Time: 211 msec    TR Vmax:        301.00 cm/s MV E velocity: 67.40 cm/s MV A velocity: 97.50 cm/s  SHUNTS MV E/A ratio:  0.69        Systemic VTI:  0.21 m MV A Prime:    12.1 cm/s   Systemic Diam: 1.80 cm Lorine Bears MD Electronically signed by Lorine Bears MD Signature Date/Time: 05/13/2020/9:10:29 AM    Final         Scheduled Meds: . Chlorhexidine Gluconate Cloth  6 each Topical Daily  . diltiazem  120 mg Oral  Daily  . furosemide  40 mg Intravenous Q12H  . insulin aspart  0-15 Units Subcutaneous TID WC  . ipratropium-albuterol  3 mL Nebulization Q6H  . levothyroxine  150 mcg Oral QAC breakfast  . mouth rinse  15 mL Mouth Rinse BID  . methylPREDNISolone (SOLU-MEDROL) injection  40 mg Intravenous Q8H  . potassium chloride  10 mEq Oral Daily  . rivaroxaban  20 mg Oral Q breakfast  . senna-docusate  1 tablet Oral BID  . sodium chloride flush  3 mL Intravenous Q12H  . venlafaxine XR  150 mg Oral Q breakfast   Continuous Infusions: . sodium chloride       LOS: 3 days    Time spent: 38 minutes spent on chart review, discussion with nursing staff, consultants, updating family and interview/physical exam; more than 50% of that time was spent in counseling and/or coordination of care.    Alvira Philips [place], DO [facility] Available via Epic secure chat 7am-7pm After these hours, please refer to coverage provider listed on [email or web address] 05/14/2020, 9:56 AM

## 2020-05-14 NOTE — Care Management Important Message (Signed)
Important Message  Patient Details  Name: Alexandra Black MRN: [id number] Date of Birth: 22-Oct-1960   Medicare Important Message Given:  N/A - LOS <3 / Initial given by admissions     Olegario Messier A [name] 05/14/2020, 7:07 AM

## 2020-05-14 NOTE — TOC Progression Note (Signed)
Transition of Care [facility]) - Progression Note    Patient Details  Name: Alexandra Black MRN: [id number] Date of Birth: 08-19-1961  Transition of Care [facility]) CM/SW Contact  Allayne Butcher, RN Phone Number: 05/14/2020, 11:23 AM  Clinical Narrative:    RNCM spoke with patient about SNF, informed patient that [facility] has offered a bed, patient reports that she doesn't really know anything about the facilities around here, encouraged her to look them up.  Patient is still on 7L HFNC.  [employer] is working on Geographical information systems officer approved for home.    Expected Discharge Plan: Skilled Nursing Facility Barriers to Discharge: Continued Medical Work up  Expected Discharge Plan and Services Expected Discharge Plan: Skilled Nursing Facility   Discharge Planning Services: CM Consult Post Acute Care Choice: Skilled Nursing Facility Living arrangements for the past 2 months: Apartment                           HH Arranged: NA           Social Determinants of Health (SDOH) Interventions    Readmission Risk Interventions No flowsheet data found.

## 2020-05-15 DIAGNOSIS — J9601 Acute respiratory failure with hypoxia: Secondary | ICD-10-CM | POA: Diagnosis not present

## 2020-05-15 LAB — BASIC METABOLIC PANEL
Anion gap: 11 (ref 5–15)
BUN: 33 mg/dL — ABNORMAL HIGH (ref 6–20)
CO2: 36 mmol/L — ABNORMAL HIGH (ref 22–32)
Calcium: 9 mg/dL (ref 8.9–10.3)
Chloride: 91 mmol/L — ABNORMAL LOW (ref 98–111)
Creatinine, Ser: 1 mg/dL (ref 0.44–1.00)
GFR calc Af Amer: >60 mL/min (ref >60)
GFR calc non Af Amer: >60 mL/min (ref >60)
Glucose, Bld: 194 mg/dL — ABNORMAL HIGH (ref 70–99)
Potassium: 4.6 mmol/L (ref 3.5–5.1)
Sodium: 138 mmol/L (ref 135–145)

## 2020-05-15 LAB — GLUCOSE, CAPILLARY
Glucose-Capillary: 171 mg/dL — ABNORMAL HIGH (ref 70–99)
Glucose-Capillary: 254 mg/dL — ABNORMAL HIGH (ref 70–99)
Glucose-Capillary: 166 mg/dL — ABNORMAL HIGH (ref 70–99)
Glucose-Capillary: 239 mg/dL — ABNORMAL HIGH (ref 70–99)

## 2020-05-15 NOTE — Progress Notes (Signed)
PROGRESS NOTE    Alexandra Black  YKZ:[id number] DOB: 06-Sep-1961 DOA: 05/11/2020 PCP: Franciso Bend, NP    Brief Narrative:  Alexandra Black is an 59 y.o. female with past medical history significant for morbid obesity BMI greater than 70, COPD/chronic bronchitis, OSA, DM 2, probable congestive heart failure, who recently moved to this area from [place] and has had difficulty getting her CPAP mask and medications filled since her move 3 months ago.  Patient was in her usual state of health until 2 to 3 days ago when she complained of new hemoptysis and increasing shortness of breath as well as dysuria.  Per patient's sister who is her power of attorney and her caregiver, patient become short of breath with speaking and with eating.  She is however able to walk across the room and to the kitchen using a walker at baseline.  Since her move patient has had decreasing functional capacity.  Also of note patient is supposed to have a home health aide to help with her personal hygiene and apparently the home health aide has not been coming so patient has been incontinent of urine and sitting in her urine.  Patient sister advised her to come to the ED when she complained of dysuria and increasing shortness of breath yesterday.  Patient sister and daughter do not think that patient has had fevers.  Patient's daughter did see the hemoptysis and thought that the phlegm was faintly pink.  She herself attributed it to patient's coughing.  Of note patient is on Xarelto however it is unclear whether or not patient has been taking the Xarelto as prescribed as patient usually manages her own medications and it is unclear whether she has been getting them filled.  Patient's daughter states that she has not been taking her "water pills".  Of note patient had been admitted to a hospital in [place] 1 year ago with similar symptoms and was possibly diagnosed with congestive heart failure but it is unclear.   Patient has never had a heart attack as far as her sister and daughter know.  The patient was noted to be hypoxic with O2 saturations in the mid 70s on room air.  Chest x-ray done showed interstitial infiltrates consistent with pulmonary edema.  She was given 60 mg Lasix IV and has put out 300 cc so far. She was initially started on CPAP however continued to desaturate and was transitioned to BiPAP.  Assessment & Plan:   Principal Problem:   Acute respiratory failure with hypoxia (HCC) Active Problems:   OSA (obstructive sleep apnea)   Atrial flutter (HCC)   Depression   Hypothyroidism   COPD (chronic obstructive pulmonary disease) (HCC)   Morbid obesity with BMI of 70 and over, adult (HCC)   GERD (gastroesophageal reflux disease)   Type 2 diabetes mellitus without complication (HCC)   Acute on chronic hypercapnic respiratory failure Patient presenting with confusion.  Found to have elevated PaCO2 of 81.  Has been noncompliant with her home BiPAP versus CPAP secondary to issues with her "mask".  This is complicated by her severe morbid obesity with a BMI of 83 with likely underlying obesity hypoventilation syndrome/obstructive sleep apnea. --See treatment below, transition of care assisting with obtaining a trilogy ventilator for discharge  Acute COPD exacerbation-taper solumedrol as tolerated  Duo nebs every 6, oxygen requirement came down to 6 L high flow nasal cannula.  Keep saturation above 88%.  Obstructive sleep apnea Obesity hypoventilation syndrome BMI 83.97.  Previously on BiPAP/CPAP  at home but issues with device. --Continue BiPAP during the day while sleeping and nightly. --Transition of care assisting for trilogy vent on discharge  Acute on chronic diastolic congestive heart failure Recently moved to the area from [place].  No previous TTE available for review in the EMR.  Was previously was prescribed furosemide 40 mg p.o. daily, apparently not taking.  TTE with EF  55-60%, no LV regional wall motion abnormalities, mild LVH, grade 1 diastolic dysfunction. --continue furosemide 40 mg IV twice daily --Continue supplemental oxygen as above --Monitor renal function closely daily with aggressive IV diuresis  Hypothyroidism --Continue levothyroxine 150 mcg p.o. daily  Paroxysmal atrial fibrillation --Continue Cardizem 120 mg p.o. daily --Continue anticoagulation with Xarelto  Type 2 diabetes mellitus Reportedly not taking any medications at home.  Hemoglobin A1c 6.5. --Insulin sliding scale for coverage --CBGs before every meal/at bedtime  Depression --Continue venlafaxine 150 mg p.o. daily  Severe morbid obesity Body mass index is 82.97 kg/m.  Patient previously underwent weight loss surgery with apparent banding at [facility] in [place].  Previously follows with bariatric surgery but has been lost to follow-up since 2018; in which she has had significant weight gain since.  Discussed with patient need for aggressive weight loss measures and lifestyle changes as this complicates all facets of care.  Would benefit from reengagement with bariatric surgery on discharge.  Weakness, deconditioning: PT/OT with recommendations of SNF placement.  Patient in agreement.  Transitions of care for placement.   DVT prophylaxis: Xarelto Code Status: Full code Family Communication: No family present at bedside  Disposition Plan:  Status is: Inpatient  Remains inpatient appropriate because:Altered mental status and Unsafe d/c plan   Dispo: The patient is from: Home              Anticipated d/c is to: SNF              Anticipated d/c date is: 2 days              Patient currently is not medically stable to d/c.  Continues on 7 L nasal cannula, awaiting to obtain trilogy vent  Consultants:   PCCM  Procedures:  TTE: IMPRESSIONS    1. Left ventricular ejection fraction, by estimation, is 55 to 60%. The  left ventricle has normal function. The left  ventricle has no regional  wall motion abnormalities. There is mild left ventricular hypertrophy.  Left ventricular diastolic parameters  are consistent with Grade I diastolic dysfunction (impaired relaxation).  2. Right ventricular systolic function is normal. The right ventricular  size is normal. There is mildly elevated pulmonary artery systolic  pressure.  3. The mitral valve is normal in structure. Trivial mitral valve  regurgitation. No evidence of mitral stenosis.  4. The aortic valve is normal in structure. Aortic valve regurgitation is  not visualized. No aortic stenosis is present.   Antimicrobials:   None   Subjective: Patient seen at bedside was in deep sleep when I walked in.  She was concerned that somebody kept her n.p.o. overnight as she was choking on food.  Discussion with speech therapist patient has chronic globus sensation and swallowing difficulties.  Objective: Vitals:   05/15/20 0414 05/15/20 0756 05/15/20 0826 05/15/20 1143  BP: (!) 134/75  (!) 138/83 (!) 132/83  Pulse: 79  78 65  Resp: 16  18 18   Temp: 98 F (36.7 C)  98 F (36.7 C) 98.2 F (36.8 C)  TempSrc: Oral     SpO2: 92%  92% 90% 94%  Weight:      Height:        Intake/Output Summary (Last 24 hours) at 05/15/2020 1153 Last data filed at 05/15/2020 0827 Gross per 24 hour  Intake --  Output 6400 ml  Net -6400 ml   Filed Weights   05/11/20 0813 05/11/20 1800  Weight: (!) 206.8 kg (!) 209.1 kg    Examination:  General exam: Appears calm and comfortable, morbidly obese Respiratory system: Coarse breath sounds bilateral decreased at bases with some mild crackles, no wheezing, normal respiratory effort, on 6 L [place] w/ spO2 92% Cardiovascular system: S1 & S2 heard, RRR. No JVD, murmurs, rubs, gallops or clicks. No pedal edema. Gastrointestinal system: Abdomen is nondistended, soft and nontender. No organomegaly or masses felt. Normal bowel sounds heard. Central nervous system: Alert and  oriented. No focal neurological deficits. Extremities: Symmetric 5 x 5 power. Skin: No rashes, lesions or ulcers Psychiatry: Judgement and insight appear poor. Mood & affect appropriate.     Data Reviewed: I have personally reviewed following labs and imaging studies  CBC: Recent Labs  Lab 05/11/20 0823 05/12/20 0554 05/13/20 0422  WBC 10.4 9.0 13.3*  HGB 12.9 13.2 12.8  HCT 43.4 45.0 40.9  MCV 78.5* 78.4* 74.8*  PLT 283 303 307   Basic Metabolic Panel: Recent Labs  Lab 05/11/20 0823 05/12/20 0554 05/13/20 0422 05/14/20 0433 05/15/20 0552  NA 141 142 141 139 138  K 4.9 4.4 4.5 4.9 4.6  CL 103 96* 94* 92* 91*  CO2 28 36* 34* 36* 36*  GLUCOSE 172* 171* 197* 202* 194*  BUN 17 15 31* 34* 33*  CREATININE 0.96 0.96 1.09* 0.98 1.00  CALCIUM 9.1 9.4 8.7* 8.9 9.0  MG  --  1.9 2.4  --   --   PHOS  --  4.3  --   --   --    GFR: Estimated Creatinine Clearance: 109.4 mL/min (by C-G formula based on SCr of 1 mg/dL). Liver Function Tests: Recent Labs  Lab 05/11/20 0823  AST 56*  ALT 49*  ALKPHOS 110  BILITOT 1.1  PROT 7.9  ALBUMIN 3.9   No results for input(s): LIPASE, AMYLASE in the last 168 hours. No results for input(s): AMMONIA in the last 168 hours. Coagulation Profile: No results for input(s): INR, PROTIME in the last 168 hours. Cardiac Enzymes: No results for input(s): CKTOTAL, CKMB, CKMBINDEX, TROPONINI in the last 168 hours. BNP (last 3 results) No results for input(s): PROBNP in the last 8760 hours. HbA1C: No results for input(s): HGBA1C in the last 72 hours. CBG: Recent Labs  Lab 05/14/20 1138 05/14/20 1619 05/14/20 2133 05/15/20 0828 05/15/20 1145  GLUCAP 194* 193* 232* 171* 254*   Lipid Profile: No results for input(s): CHOL, HDL, LDLCALC, TRIG, CHOLHDL, LDLDIRECT in the last 72 hours. Thyroid Function Tests: No results for input(s): TSH, T4TOTAL, FREET4, T3FREE, THYROIDAB in the last 72 hours. Anemia Panel: No results for input(s):  VITAMINB12, FOLATE, FERRITIN, TIBC, IRON, RETICCTPCT in the last 72 hours. Sepsis Labs: No results for input(s): PROCALCITON, LATICACIDVEN in the last 168 hours.  Recent Results (from the past 240 hour(s))  SARS Coronavirus 2 by RT PCR (hospital order, performed in [facility] lab) Nasopharyngeal Nasopharyngeal Swab     Status: None   Collection Time: 05/11/20  8:52 AM   Specimen: Nasopharyngeal Swab  Result Value Ref Range Status   SARS Coronavirus 2 NEGATIVE NEGATIVE Final    Comment: (NOTE) SARS-CoV-2 target  nucleic acids are NOT DETECTED.  The SARS-CoV-2 RNA is generally detectable in upper and lower respiratory specimens during the acute phase of infection. The lowest concentration of SARS-CoV-2 viral copies this assay can detect is 250 copies / mL. A negative result does not preclude SARS-CoV-2 infection and should not be used as the sole basis for treatment or other patient management decisions.  A negative result may occur with improper specimen collection / handling, submission of specimen other than nasopharyngeal swab, presence of viral mutation(s) within the areas targeted by this assay, and inadequate number of viral copies (<250 copies / mL). A negative result must be combined with clinical observations, patient history, and epidemiological information.  Fact Sheet for Patients:   BoilerBrush.com.cy  Fact Sheet for Healthcare Providers: https://pope.com/  This test is not yet approved or  cleared by the [place] FDA and has been authorized for detection and/or diagnosis of SARS-CoV-2 by FDA under an Emergency Use Authorization (EUA).  This EUA will remain in effect (meaning this test can be used) for the duration of the COVID-19 declaration under Section 564(b)(1) of the Act, 21 U.S.C. section 360bbb-3(b)(1), unless the authorization is terminated or revoked sooner.  Performed at [facility],  [address] [place], [place] [address]   MRSA PCR Screening     Status: None   Collection Time: 05/11/20  7:50 PM   Specimen: Nasopharyngeal  Result Value Ref Range Status   MRSA by PCR NEGATIVE NEGATIVE Final    Comment:        The GeneXpert MRSA Assay (FDA approved for NASAL specimens only), is one component of a comprehensive MRSA colonization surveillance program. It is not intended to diagnose MRSA infection nor to guide or monitor treatment for MRSA infections. Performed at [facility], [address], [place], [place] [address]          Radiology Studies: No results found.      Scheduled Meds: . Chlorhexidine Gluconate Cloth  6 each Topical Daily  . diltiazem  120 mg Oral Daily  . furosemide  40 mg Intravenous Q12H  . insulin aspart  0-15 Units Subcutaneous TID WC  . ipratropium-albuterol  3 mL Nebulization Q6H  . levothyroxine  150 mcg Oral QAC breakfast  . mouth rinse  15 mL Mouth Rinse BID  . methylPREDNISolone (SOLU-MEDROL) injection  40 mg Intravenous Q8H  . potassium chloride  10 mEq Oral Daily  . rivaroxaban  20 mg Oral Q breakfast  . senna-docusate  1 tablet Oral BID  . sodium chloride flush  3 mL Intravenous Q12H  . venlafaxine XR  150 mg Oral Q breakfast   Continuous Infusions: . sodium chloride       LOS: 4 days    Alwyn Ren, MD [facility] Available via Epic secure chat 7am-7pm After these hours, please refer to coverage provider listed on [email or web address] 05/15/2020, 11:53 AM

## 2020-05-15 NOTE — Evaluation (Signed)
Clinical/Bedside Swallow Evaluation Patient Details  Name: Alexandra Black MRN: [id number] Date of Birth: 30-Oct-1960  Today's Date: 05/15/2020 Time: SLP Start Time (ACUTE ONLY): 0830 SLP Stop Time (ACUTE ONLY): 0902 SLP Time Calculation (min) (ACUTE ONLY): 32 min  Past Medical History:  Past Medical History:  Diagnosis Date  . Atrial flutter (HCC)   . Depression   . Hypothyroidism   . Iron deficiency   . OSA (obstructive sleep apnea)   . Vitamin B12 deficiency    Past Surgical History: History reviewed. No pertinent surgical history. HPI:  Alexandra Black is an 59 y.o. female with past medical history significant for morbid obesity BMI greater than 70, COPD/chronic bronchitis, OSA, DM 2, probable congestive heart failure, who recently moved to this area from [place] and has had difficulty getting her CPAP mask and medications filled since her move 3 months ago.  Patient was in her usual state of health until 2 to 3 days ago when she complained of new hemoptysis and increasing shortness of breath as well as dysuria. Chest x-ray done showed interstitial infiltrates consistent with pulmonary edema. Patient previously underwent weight loss surgery with apparent banding at [facility] in [place].  Previously follows with bariatric surgery but has been lost to follow-up since 2018; in which she has had significant weight gain since.   Assessment / Plan / Recommendation Clinical Impression  Pt presents with grossly adequate oropharyngeal abilities when consuming regular diet textures and thin liquids via straw. Pt with complex history of bariatric surgery and she reports chronic globus sensation with need to regurgitate food items d/t globus sensation. She reports also having to drink liquids to aid in solids "going down." During this evaluation, pt consumed trials of graham crackers and thin liquids without any overt s/s of aspiration. She reported globus sensation to upper chest with graham  crackers. As this is a chronic issue, pt requests return to regular diet and referral to GI to assess for cause of globus sensation. This Clinical research associate provided extensive education on risks of possible aspiration of regurgitated material. Pt voiced understanding but still requested return to regular diet. At this time, ST intervention is not indicated. ST will sign off.  SLP Visit Diagnosis: Dysphagia, pharyngoesophageal phase (R13.14)    Aspiration Risk  Mild aspiration risk    Diet Recommendation   Age appropriate regular, thin liquids via straw  Medication Administration: Whole meds with liquid    Other  Recommendations Recommended Consults: Consider GI evaluation Oral Care Recommendations: Oral care BID   Follow up Recommendations None        Swallow Study   General Date of Onset: 05/14/20 HPI: Alexandra Black is an 59 y.o. female with past medical history significant for morbid obesity BMI greater than 70, COPD/chronic bronchitis, OSA, DM 2, probable congestive heart failure, who recently moved to this area from [place] and has had difficulty getting her CPAP mask and medications filled since her move 3 months ago.  Patient was in her usual state of health until 2 to 3 days ago when she complained of new hemoptysis and increasing shortness of breath as well as dysuria. Chest x-ray done showed interstitial infiltrates consistent with pulmonary edema. Patient previously underwent weight loss surgery with apparent banding at [facility] in [place].  Previously follows with bariatric surgery but has been lost to follow-up since 2018; in which she has had significant weight gain since. Type of Study: Bedside Swallow Evaluation Previous Swallow Assessment: none in chart Diet Prior to this Study: Dysphagia 3 (  soft);Thin liquids Temperature Spikes Noted: No Respiratory Status: Nasal cannula (6liters) History of Recent Intubation: No Behavior/Cognition: Alert;Cooperative;Pleasant mood Oral Cavity  Assessment: Within Functional Limits Oral Care Completed by SLP: No Oral Cavity - Dentition: Adequate natural dentition Vision: Functional for self-feeding Self-Feeding Abilities: Able to feed self Patient Positioning: Upright in bed Baseline Vocal Quality: Normal Volitional Cough: Strong Volitional Swallow: Able to elicit    Oral/Motor/Sensory Function Overall Oral Motor/Sensory Function: Within functional limits   Ice Chips Ice chips: Not tested   Thin Liquid Thin Liquid: Within functional limits Presentation: Self Fed;Straw    Nectar Thick Nectar Thick Liquid: Not tested   Honey Thick Honey Thick Liquid: Not tested   Puree Puree: Not tested   Solid     Solid: Within functional limits Presentation: Self Fed     Alexandra Black B. Dreama Saa M.S., CCC-SLP, [facility] Speech-Language Pathologist Rehabilitation Services Office [phone]  Alexandra Black 05/15/2020,4:19 PM

## 2020-05-15 NOTE — Progress Notes (Signed)
Physical Therapy Treatment Patient Details Name: Alexandra Black MRN: [id number] DOB: 08-Jul-1961 Today's Date: 05/15/2020    History of Present Illness Alexandra Black is a 59 y/o female who was admitted for acute respiratory failure with hypoxia. Chief complaints included hemoptysis, increasing SOB, and dysuria. PMH includes morbid obesity (BMI greater tahn 70), COPD/chronic bronchitis, OSA, probable congestive heart failure, SOB at baseline, A-flutter, depression, hypothyroidism, iron deficiency, and vitamin B12 deficiency.    PT Comments    Pt pleasant and motivated to participate during the session. Pt reported significant pain due to HA as well as pain behind bilateral knees. Pt very engaged and eager to perform exercises today. Pt on 4L O2 today; down significantly from 12L last PT session. Pt was first guided through supine exercises which she was able to perform with no physical assist provided by therapist. Pt had decreased ROM with SLR, hip abd, and heel slides, likely secondary to increased body habitus and increased effort required to move extremities. Pt very strong throughout exercises; multiple breaks taken due to pt appearing SOB, however breathing rate quickly returned to baseline following each break. SpO2 closely monitored and remained between 88-91% through supine exercises. Pt able to roll to the R and the L as well as scoot self up bed with mod-I; pt had a heavy reliance on bed rails as well as a significantly increased effort, appearing quite SOB after 2x roll. Pt able to come to standing with 2x UE support on countertop with supervision; SpO2 dropped to 87% and HR inc to 104 upon standing, which reflects the additional effort required to change positions. With BUE maintained on counter, pt able to step forwards and backwards a total of ~15ft with CGA. Pt again appeared SOB after this exercise as well, with SpO2 noted to drop to 88% and HR increased to 111bpm. Pt returned to sit  and then supine with mod-I, again with a heavy reliance on bed rails. Overall pt moved well, but required significantly increased effort to do so, which limits pt's ability to ambulate safely throughout the home. Pt will benefit from PT services in a SNF setting upon discharge to safely address deficits listed in patient problem list for decreased caregiver assistance and eventual return to PLOF.   Follow Up Recommendations  SNF     Equipment Recommendations  3in1 (PT)    Recommendations for Other Services       Precautions / Restrictions Precautions Precautions: Fall Restrictions Weight Bearing Restrictions: No    Mobility  Bed Mobility Overal bed mobility: Modified Independent Bed Mobility: Rolling;Supine to Sit;Sit to Supine Rolling: Modified independent (Device/Increase time)   Supine to sit: Modified independent (Device/Increase time) Sit to supine: Modified independent (Device/Increase time)   General bed mobility comments: Pt req heavy use of bilateral hand rails for all bed mobility; notable increased effort but able to roll in both directions, supine-sit, and sit-supine without any physical assist. Very strong.  Transfers Overall transfer level: Needs assistance Equipment used:  (BUE on countertop) Transfers: Sit to/from Stand Sit to Stand: Supervision         General transfer comment: significantly increased effort to come to standing but no physical assist required. Significant trunk flexion  Ambulation/Gait Ambulation/Gait assistance: Min guard Gait Distance (Feet): 10 Feet Assistive device:  (BUE support on countertop) Gait Pattern/deviations: Step-through pattern;Trunk flexed     General Gait Details: pt able to walk forwards and backwards to counter from EOB with BUE supported throughout and CGA provided. Total distance walked ~  14ft; significant effort for pt and pt appeared OOB   Optometrist    Modified Rankin  (Stroke Patients Only)       Balance Overall balance assessment: Mild deficits observed, not formally tested                                          Cognition Arousal/Alertness: Awake/alert Behavior During Therapy: WFL for tasks assessed/performed Overall Cognitive Status: Within Functional Limits for tasks assessed                                 General Comments: Pt A&O x 4.      Exercises Total Joint Exercises Ankle Circles/Pumps: AROM;Both;10 reps Quad Sets: Strengthening;Both;10 reps Gluteal Sets: Strengthening;Both;10 reps Heel Slides: AROM;Strengthening;Both;10 reps Hip ABduction/ADduction: AROM;Strengthening;Both;10 reps Straight Leg Raises: AROM;Strengthening;Both;10 reps Other Exercises: rolling L&R  Other Exercises: education and implementation of pursed breathing    General Comments        Pertinent Vitals/Pain Pain Assessment: 0-10 Pain Score: 10-Worst pain ever Pain Location: Headache (primary); pain on back of BLE Pain Descriptors / Indicators: Grimacing;Discomfort;Aching;Headache Pain Intervention(s): Premedicated before session;Repositioned;Limited activity within patient's tolerance    Home Living                      Prior Function            PT Goals (current goals can now be found in the care plan section) Progress towards PT goals: Progressing toward goals    Frequency    Min 2X/week      PT Plan Current plan remains appropriate    Co-evaluation              AM-PAC PT "6 Clicks" Mobility   Outcome Measure  Help needed turning from your back to your side while in a flat bed without using bedrails?: A Lot Help needed moving from lying on your back to sitting on the side of a flat bed without using bedrails?: A Lot Help needed moving to and from a bed to a chair (including a wheelchair)?: A Little Help needed standing up from a chair using your arms (e.g., wheelchair or bedside  chair)?: None Help needed to walk in hospital room?: A Little Help needed climbing 3-5 steps with a railing? : A Lot 6 Click Score: 16    End of Session Equipment Utilized During Treatment: Oxygen Activity Tolerance: Patient tolerated treatment well Patient left: in bed;with call bell/phone within reach;with bed alarm set Nurse Communication: Mobility status PT Visit Diagnosis: Unsteadiness on feet (R26.81);Muscle weakness (generalized) (M62.81);Pain;Difficulty in walking, not elsewhere classified (R26.2) Pain - Right/Left: Left (BLE - behind knees) Pain - part of body: Leg     Time: 1535-1606 PT Time Calculation (min) (ACUTE ONLY): 31 min  Charges:                        Luis Nickles SPT 05/15/20, 5:20 PM

## 2020-05-16 ENCOUNTER — Encounter: Payer: Self-pay | Admitting: Internal Medicine

## 2020-05-16 ENCOUNTER — Inpatient Hospital Stay: Payer: Medicare Other

## 2020-05-16 DIAGNOSIS — I5033 Acute on chronic diastolic (congestive) heart failure: Secondary | ICD-10-CM | POA: Diagnosis not present

## 2020-05-16 DIAGNOSIS — R0902 Hypoxemia: Secondary | ICD-10-CM

## 2020-05-16 DIAGNOSIS — E119 Type 2 diabetes mellitus without complications: Secondary | ICD-10-CM

## 2020-05-16 DIAGNOSIS — G4733 Obstructive sleep apnea (adult) (pediatric): Secondary | ICD-10-CM

## 2020-05-16 DIAGNOSIS — J810 Acute pulmonary edema: Secondary | ICD-10-CM

## 2020-05-16 DIAGNOSIS — J449 Chronic obstructive pulmonary disease, unspecified: Secondary | ICD-10-CM

## 2020-05-16 DIAGNOSIS — I480 Paroxysmal atrial fibrillation: Secondary | ICD-10-CM

## 2020-05-16 DIAGNOSIS — Z6841 Body Mass Index (BMI) 40.0 and over, adult: Secondary | ICD-10-CM

## 2020-05-16 DIAGNOSIS — J9601 Acute respiratory failure with hypoxia: Secondary | ICD-10-CM | POA: Diagnosis not present

## 2020-05-16 LAB — BASIC METABOLIC PANEL
Anion gap: 14 (ref 5–15)
BUN: 33 mg/dL — ABNORMAL HIGH (ref 6–20)
CO2: 35 mmol/L — ABNORMAL HIGH (ref 22–32)
Calcium: 8.6 mg/dL — ABNORMAL LOW (ref 8.9–10.3)
Chloride: 87 mmol/L — ABNORMAL LOW (ref 98–111)
Creatinine, Ser: 0.92 mg/dL (ref 0.44–1.00)
GFR calc Af Amer: >60 mL/min (ref >60)
GFR calc non Af Amer: >60 mL/min (ref >60)
Glucose, Bld: 229 mg/dL — ABNORMAL HIGH (ref 70–99)
Potassium: 4.8 mmol/L (ref 3.5–5.1)
Sodium: 136 mmol/L (ref 135–145)

## 2020-05-16 LAB — GLUCOSE, CAPILLARY
Glucose-Capillary: 204 mg/dL — ABNORMAL HIGH (ref 70–99)
Glucose-Capillary: 215 mg/dL — ABNORMAL HIGH (ref 70–99)
Glucose-Capillary: 199 mg/dL — ABNORMAL HIGH (ref 70–99)
Glucose-Capillary: 225 mg/dL — ABNORMAL HIGH (ref 70–99)

## 2020-05-16 MED ORDER — METHYLPREDNISOLONE SODIUM SUCC 40 MG IJ SOLR
40.0000 mg | Freq: Every day | INTRAMUSCULAR | Status: DC
  Administered 2020-05-17: 40 mg via INTRAVENOUS
  Filled 2020-05-16: qty 1

## 2020-05-16 NOTE — Progress Notes (Signed)
Inpatient Diabetes Program Recommendations  AACE/ADA: New Consensus Statement on Inpatient Glycemic Control (2015)  Target Ranges:  Prepandial:   less than 140 mg/dL      Peak postprandial:   less than 180 mg/dL (1-2 hours)      Critically ill patients:  140 - 180 mg/dL   Lab Results  Component Value Date   GLUCAP 215 (H) 05/16/2020   HGBA1C 6.5 (H) 05/11/2020    Review of Glycemic Control  Diabetes history: DM2 Outpatient Diabetes medications: metformin 500 mg bid (not taking) Current orders for Inpatient glycemic control: Novolog 0-15 units tidwc  Blood sugars today: 204, 215 mg/dL  Inpatient Diabetes Program Recommendations:     Consider adding Novolog 2-3 units tidwc for meal coverage insulin, if appropriate.  Will continue to follow.  Thank you. Ailene Ards, RD, LDN, CDE Inpatient Diabetes Coordinator [phone]

## 2020-05-16 NOTE — Progress Notes (Addendum)
PROGRESS NOTE    Alexandra Black  RDE:[id number] DOB: 16-Jul-1961 DOA: 05/11/2020 PCP: Franciso Bend, NP    Brief Narrative:  Alexandra Black is an 59 y.o. female with past medical history significant for morbid obesity BMI greater than 70, COPD/chronic bronchitis, OSA, DM 2, probable congestive heart failure, who recently moved to this area from [place] and has had difficulty getting her CPAP mask and medications filled since her move 3 months ago.  Patient was in her usual state of health until 2 to 3 days ago when she complained of new hemoptysis and increasing shortness of breath as well as dysuria.  Per patient's sister who is her power of attorney and her caregiver, patient become short of breath with speaking and with eating.  She is however able to walk across the room and to the kitchen using a walker at baseline.  Since her move patient has had decreasing functional capacity.  Also of note patient is supposed to have a home health aide to help with her personal hygiene and apparently the home health aide has not been coming so patient has been incontinent of urine and sitting in her urine.  Patient sister advised her to come to the ED when she complained of dysuria and increasing shortness of breath yesterday.  Patient sister and daughter do not think that patient has had fevers.  Patient's daughter did see the hemoptysis and thought that the phlegm was faintly pink.  She herself attributed it to patient's coughing.  Of note patient is on Xarelto however it is unclear whether or not patient has been taking the Xarelto as prescribed as patient usually manages her own medications and it is unclear whether she has been getting them filled.  Patient's daughter states that she has not been taking her "water pills".  Of note patient had been admitted to a hospital in [place] 1 year ago with similar symptoms and was possibly diagnosed with congestive heart failure but it is unclear.   Patient has never had a heart attack as far as her sister and daughter know.  The patient was noted to be hypoxic with O2 saturations in the mid 70s on room air.  Chest x-ray done showed interstitial infiltrates consistent with pulmonary edema.  She was given 60 mg Lasix IV and has put out 300 cc so far. She was initially started on CPAP however continued to desaturate and was transitioned to BiPAP.  Assessment & Plan:   Principal Problem:   Acute respiratory failure with hypoxia (HCC) Active Problems:   OSA (obstructive sleep apnea)   Atrial flutter (HCC)   Depression   Hypothyroidism   COPD (chronic obstructive pulmonary disease) (HCC)   Morbid obesity with BMI of 70 and over, adult (HCC)   GERD (gastroesophageal reflux disease)   Type 2 diabetes mellitus without complication (HCC)   Acute on chronic hypercapnic respiratory failure Patient presenting with confusion.  Found to have elevated PaCO2 of 81.  Has been noncompliant with her home BiPAP versus CPAP secondary to issues with her "mask".  This is complicated by her severe morbid obesity with a BMI of 83 with likely underlying obesity hypoventilation syndrome/obstructive sleep apnea.TOC assisting with obtaining a trilogy ventilator for discharge to skilled nursing facility.  Acute COPD exacerbation-taper solumedrol as tolerated  Duo nebs every 6, oxygen requirement came down to 6 L high flow nasal cannula.  Keep saturation above 88%.  Obstructive sleep apnea Obesity hypoventilation syndrome BMI 83.97.  Previously on BiPAP/CPAP at home  but issues with device. --Continue BiPAP during the day while sleeping and nightly. --Transition of care assisting for trilogy vent on discharge  Acute on chronic diastolic congestive heart failure Recently moved to the area from [place].  No previous TTE available for review in the EMR.  Was previously was prescribed furosemide 40 mg p.o. daily, apparently not taking.  TTE with EF 55-60%, no LV  regional wall motion abnormalities, mild LVH, grade 1 diastolic dysfunction. --continue furosemide 40 mg IV twice daily.  Negative by 10.4 L. --Continue supplemental oxygen as above- --renal function stable creatinine 0.92. --Monitor renal function closely daily with aggressive IV diuresis  Hypothyroidism --Continue levothyroxine 150 mcg p.o. daily  Paroxysmal atrial fibrillation --Continue Cardizem 120 mg p.o. daily --Continue anticoagulation with Xarelto  Type 2 diabetes mellitus Reportedly not taking any medications at home.  Hemoglobin A1c 6.5. --Insulin sliding scale for coverage --CBGs before every meal/at bedtime  Depression --Continue venlafaxine 150 mg p.o. daily  Severe morbid obesity Body mass index is 82.97 kg/m.  Patient previously underwent weight loss surgery with apparent banding at [facility] in [place].  Previously follows with bariatric surgery but has been lost to follow-up since 2018; in which she has had significant weight gain since.  Discussed with patient need for aggressive weight loss measures and lifestyle changes as this complicates all facets of care.  Would benefit from reengagement with bariatric surgery on discharge.  Weakness, deconditioning: PT/OT with recommendations of SNF placement.  Patient in agreement.  Transitions of care for placement.   DVT prophylaxis: Xarelto Code Status: Full code Family Communication: No family present at bedside  Disposition Plan:  Status is: Inpatient  Remains inpatient appropriate because:Altered mental status and Unsafe d/c plan   Dispo: The patient is from: Home              Anticipated d/c is to: SNF              Anticipated d/c date is:1 day              Patient currently is not medically stable to d/c.  Continues on 6 L nasal cannula, awaiting to obtain trilogy vent  Consultants:   PCCM  Procedures:  TTE: IMPRESSIONS    1. Left ventricular ejection fraction, by estimation, is 55 to 60%. The    left ventricle has normal function. The left ventricle has no regional  wall motion abnormalities. There is mild left ventricular hypertrophy.  Left ventricular diastolic parameters  are consistent with Grade I diastolic dysfunction (impaired relaxation).  2. Right ventricular systolic function is normal. The right ventricular  size is normal. There is mildly elevated pulmonary artery systolic  pressure.  3. The mitral valve is normal in structure. Trivial mitral valve  regurgitation. No evidence of mitral stenosis.  4. The aortic valve is normal in structure. Aortic valve regurgitation is  not visualized. No aortic stenosis is present.   Antimicrobials:   None   Subjective: Patient laying in bed reports that she is more short of breath after converting from high flow nasal cannula to regular nasal cannula however her saturation is above 92%. Objective: Vitals:   05/16/20 0346 05/16/20 0735 05/16/20 0810 05/16/20 1147  BP: (!) 146/93  (!) 147/87 127/85  Pulse: 73  64 70  Resp: 20  20 19   Temp: 98.5 F (36.9 C)  97.7 F (36.5 C) 98.4 F (36.9 C)  TempSrc: Oral  Oral Oral  SpO2: 92% 97% 95% 98%  Weight:  Height:        Intake/Output Summary (Last 24 hours) at 05/16/2020 1301 Last data filed at 05/16/2020 1200 Gross per 24 hour  Intake 240 ml  Output 1640 ml  Net -1400 ml   Filed Weights   05/11/20 0813 05/11/20 1800  Weight: (!) 206.8 kg (!) 209.1 kg    Examination:  General exam: Appears calm and comfortable, morbidly obese Respiratory system: Coarse breath sounds bilateral decreased at bases with some mild crackles, no wheezing, normal respiratory effort, on 6 L [place] w/ spO2 92% Cardiovascular system: S1 & S2 heard, RRR. No JVD, murmurs, rubs, gallops or clicks. No pedal edema. Gastrointestinal system: Abdomen is nondistended, soft and nontender. No organomegaly or masses felt. Normal bowel sounds heard. Central nervous system: Alert and oriented. No focal  neurological deficits. Extremities: edema b/l Skin: No rashes, lesions or ulcers Psychiatry: Judgement and insight appear poor. Mood & affect appropriate.     Data Reviewed: I have personally reviewed following labs and imaging studies  CBC: Recent Labs  Lab 05/11/20 0823 05/12/20 0554 05/13/20 0422  WBC 10.4 9.0 13.3*  HGB 12.9 13.2 12.8  HCT 43.4 45.0 40.9  MCV 78.5* 78.4* 74.8*  PLT 283 303 307   Basic Metabolic Panel: Recent Labs  Lab 05/12/20 0554 05/13/20 0422 05/14/20 0433 05/15/20 0552 05/16/20 0439  NA 142 141 139 138 136  K 4.4 4.5 4.9 4.6 4.8  CL 96* 94* 92* 91* 87*  CO2 36* 34* 36* 36* 35*  GLUCOSE 171* 197* 202* 194* 229*  BUN 15 31* 34* 33* 33*  CREATININE 0.96 1.09* 0.98 1.00 0.92  CALCIUM 9.4 8.7* 8.9 9.0 8.6*  MG 1.9 2.4  --   --   --   PHOS 4.3  --   --   --   --    GFR: Estimated Creatinine Clearance: 118.9 mL/min (by C-G formula based on SCr of 0.92 mg/dL). Liver Function Tests: Recent Labs  Lab 05/11/20 0823  AST 56*  ALT 49*  ALKPHOS 110  BILITOT 1.1  PROT 7.9  ALBUMIN 3.9   No results for input(s): LIPASE, AMYLASE in the last 168 hours. No results for input(s): AMMONIA in the last 168 hours. Coagulation Profile: No results for input(s): INR, PROTIME in the last 168 hours. Cardiac Enzymes: No results for input(s): CKTOTAL, CKMB, CKMBINDEX, TROPONINI in the last 168 hours. BNP (last 3 results) No results for input(s): PROBNP in the last 8760 hours. HbA1C: No results for input(s): HGBA1C in the last 72 hours. CBG: Recent Labs  Lab 05/15/20 1145 05/15/20 1655 05/15/20 2121 05/16/20 0808 05/16/20 1147  GLUCAP 254* 166* 239* 204* 215*   Lipid Profile: No results for input(s): CHOL, HDL, LDLCALC, TRIG, CHOLHDL, LDLDIRECT in the last 72 hours. Thyroid Function Tests: No results for input(s): TSH, T4TOTAL, FREET4, T3FREE, THYROIDAB in the last 72 hours. Anemia Panel: No results for input(s): VITAMINB12, FOLATE, FERRITIN,  TIBC, IRON, RETICCTPCT in the last 72 hours. Sepsis Labs: No results for input(s): PROCALCITON, LATICACIDVEN in the last 168 hours.  Recent Results (from the past 240 hour(s))  SARS Coronavirus 2 by RT PCR (hospital order, [facility]) Nasopharyngeal Nasopharyngeal Swab     Status: None   Collection Time: 05/11/20  8:52 AM   Specimen: Nasopharyngeal Swab  Result Value Ref Range Status   SARS Coronavirus 2 NEGATIVE NEGATIVE Final    Comment: (NOTE) SARS-CoV-2 target nucleic acids are NOT DETECTED.  The SARS-CoV-2 RNA is generally detectable  in upper and lower respiratory specimens during the acute phase of infection. The lowest concentration of SARS-CoV-2 viral copies this assay can detect is 250 copies / mL. A negative result does not preclude SARS-CoV-2 infection and should not be used as the sole basis for treatment or other patient management decisions.  A negative result may occur with improper specimen collection / handling, submission of specimen other than nasopharyngeal swab, presence of viral mutation(s) within the areas targeted by this assay, and inadequate number of viral copies (<250 copies / mL). A negative result must be combined with clinical observations, patient history, and epidemiological information.  Fact Sheet for Patients:   BoilerBrush.com.cy  Fact Sheet for Healthcare Providers: https://pope.com/  This test is not yet approved or  cleared by the [place] FDA and has been authorized for detection and/or diagnosis of SARS-CoV-2 by FDA under an Emergency Use Authorization (EUA).  This EUA will remain in effect (meaning this test can be used) for the duration of the COVID-19 declaration under Section 564(b)(1) of the Act, 21 U.S.C. section 360bbb-3(b)(1), unless the authorization is terminated or revoked sooner.  Performed at [facility], [address]  [place], [place] [address]   MRSA PCR Screening     Status: None   Collection Time: 05/11/20  7:50 PM   Specimen: Nasopharyngeal  Result Value Ref Range Status   MRSA by PCR NEGATIVE NEGATIVE Final    Comment:        The GeneXpert MRSA Assay (FDA approved for NASAL specimens only), is one component of a comprehensive MRSA colonization surveillance program. It is not intended to diagnose MRSA infection nor to guide or monitor treatment for MRSA infections. Performed at [facility], [address] [place], [place] [address]          Radiology Studies: No results found.      Scheduled Meds: . diltiazem  120 mg Oral Daily  . furosemide  40 mg Intravenous Q12H  . insulin aspart  0-15 Units Subcutaneous TID WC  . ipratropium-albuterol  3 mL Nebulization Q6H  . levothyroxine  150 mcg Oral QAC breakfast  . mouth rinse  15 mL Mouth Rinse BID  . methylPREDNISolone (SOLU-MEDROL) injection  40 mg Intravenous Q8H  . potassium chloride  10 mEq Oral Daily  . rivaroxaban  20 mg Oral Q breakfast  . senna-docusate  1 tablet Oral BID  . sodium chloride flush  3 mL Intravenous Q12H  . venlafaxine XR  150 mg Oral Q breakfast   Continuous Infusions: . sodium chloride       LOS: 5 days    Alwyn Ren, MD [facility] Available via Epic secure chat 7am-7pm After these hours, please refer to coverage provider listed on [email or web address] 05/16/2020, 1:01 PM

## 2020-05-16 NOTE — Care Management Important Message (Signed)
Important Message  Patient Details  Name: Alexandra Black MRN: [id number] Date of Birth: 1961-09-14   Medicare Important Message Given:  Yes     Olegario Messier A Brittanny Levenhagen 05/16/2020, 11:06 AM

## 2020-05-16 NOTE — Telephone Encounter (Signed)
Patient calling - still at [facility]  Patient is still wanting to know if anyone of our providers can go and see her - states no cardiologist has came by  Advised patient that she would most likely need to request a cardiology consult from her nurses

## 2020-05-16 NOTE — Consult Note (Signed)
Cardiology Consult    Patient ID: Alexandra Black MRN: [id number], DOB/AGE: [birth year]   Admit date: 05/11/2020 Date of Consult: 05/16/2020  Primary Physician: Franciso Bend, NP Primary Cardiologist: Debbe Odea, MD Requesting Provider: Delight Hoh, MD  Patient Profile    Alexandra Black is a 59 y.o. female with a history of paroxysmal atrial fibrillation and flutter, HFpEF, depression, morbid obesity, hypothyroidism, iron deficiency, and arthritis, who is being seen today for the evaluation of congestive heart failure at the request of Dr. Jerolyn Center.  Past Medical History   Past Medical History:  Diagnosis Date  . (HFpEF) heart failure with preserved ejection fraction (HCC)    a. 06/2019 Echo ([facility]): EF 55-60%; b. 04/2020 Echo: EF 55-60%, no rwma, Gr1 DD. Nl RV size/fxn. Mildly elev PASP. Triv MR.  . Depression   . Hypothyroidism   . Iron deficiency   . Morbid obesity (HCC)    a. s/p bariatric surgery in 2002 (pt unsure of type of surgery).  . OSA (obstructive sleep apnea)   . Osteoarthritis   . PAF (paroxysmal atrial fibrillation) (HCC)    a. 12/2019 Zio: Avg HR 84 (32-214). Sinus rhythm w/ brief runs of Afib (2% burden - avg 112 bpm, 47-187). Pauses up to 3.5 secs.  . Paroxysmal Atrial flutter (HCC)   . Vitamin B12 deficiency     Past Surgical History:  Procedure Laterality Date  . BARIATRIC SURGERY  2002     Allergies  No Known Allergies  History of Present Illness    59 year old female with the above past medical history including paroxysmal atrial fibrillation and flutter, HFpEF, depression, morbid obesity, hypothyroidism, iron deficiency, and arthritis. She is status post bariatric surgery in 2002 and says that she lost approximately 200 pounds following surgery but over time, especially after leaving food addiction counseling, she gained it all back. She was last seen in an obesity clinic in 2015 but says in the setting of significant depression, she has  not been able to stick to any sort of weight loss plan/diet. Cardiac history dates back to September 2020, when she was admitted to [facility] with hypoxic respiratory failure. During hospitalization, she was noted to be in atrial flutter which apparently converted on diltiazem. She was placed on Xarelto therapy though says that she did not immediately start. Echocardiogram during hospitalization showed an EF of 55 to 60%. She subsequently moved to the [place] area and establish care with Dr. Azucena Cecil in February. She subsequently wore an event monitor to evaluate arrhythmic burden and was found to have paroxysmal atrial fibrillation at a 2% burden. She was also noted to have multiple pauses up to 3.5 seconds, generally in early morning hours and presumed to be secondary to sleep apnea with noncompliance. She notes that since about January, she has not been wearing her BiPAP because of difficulty with mask fitting and also came off of her furosemide due to frequent urination while she was trying to sleep. In the setting of not being on BiPAP, she noted sleep was very difficult and she would not on and off throughout the day, without any particular sleep pattern.  Over the past month, she notes that she has been partying more as her birthday was [birth year] and she has used just about every weekend to celebrate with friends. This includes traveling more, eating out more frequently, imbibing more alcohol on weekends, and also continuing to smoke a small amount of marijuana on a daily basis. In the setting of all  of the above, she has noted increasing dyspnea on exertion, lower extremity swelling, and orthopnea. Last week, her family noted her to be significantly more short of breath and she also reported an episode of hemoptysis. She was also experiencing dysuria. On July 24, she presented to the emergency department where she was saturating in the mid 70s on room air. Chest x-ray showed interstitial  infiltrates consistent with pulmonary edema. She was given 60 mg IV Lasix in the ED and was initially placed on CPAP and subsequently BiPAP. She was admitted for further evaluation. She has been maintained on intravenous Lasix and is -10.4 L since admission. She has not been weighed since admission. She notes steady improvement in lower extremity swelling and dyspnea overall, though she continues to have an intermittent productive cough. Echocardiogram here is similar to what was previously reported in [place] with an EF of 55-60%, grade 1 diastolic dysfunction, and mildly elevated PASP. No significant valvular abnormalities noted. In addition to management of heart failure, she has been treated for acute COPD exacerbation with Solu-Medrol and nebulizer. She is not currently wheezing. She has no history of chest pain.  Inpatient Medications    . diltiazem  120 mg Oral Daily  . furosemide  40 mg Intravenous Q12H  . insulin aspart  0-15 Units Subcutaneous TID WC  . ipratropium-albuterol  3 mL Nebulization Q6H  . levothyroxine  150 mcg Oral QAC breakfast  . mouth rinse  15 mL Mouth Rinse BID  . [START ON 05/17/2020] methylPREDNISolone (SOLU-MEDROL) injection  40 mg Intravenous Daily  . potassium chloride  10 mEq Oral Daily  . rivaroxaban  20 mg Oral Q breakfast  . senna-docusate  1 tablet Oral BID  . sodium chloride flush  3 mL Intravenous Q12H  . venlafaxine XR  150 mg Oral Q breakfast    Family History    Family History  Problem Relation Age of Onset  . Heart disease Mother   . Hypertension Mother   . Peripheral Artery Disease Father   . Obesity Sister   . Obesity Brother   . Obesity Sister   . Depression Sister   . Obesity Sister    She indicated that her mother is deceased. She indicated that her father is deceased. She indicated that only one of her three sisters is alive. She indicated that her brother is deceased.   Social History    Social History   Socioeconomic History    . Marital status: Divorced    Spouse name: Not on file  . Number of children: Not on file  . Years of education: Not on file  . Highest education level: Not on file  Occupational History  . Not on file  Tobacco Use  . Smoking status: Current Every Day Smoker    Packs/day: 1.00  . Smokeless tobacco: Never Used  . Tobacco comment: Currently smoking 1 ppd.  Quit for 20 yrs between ages of ~ [age].  Substance and Sexual Activity  . Alcohol use: Yes    Alcohol/week: 2.0 standard drinks    Types: 2 Glasses of wine per week  . Drug use: Yes    Types: Marijuana    Comment: 2 puffs on a joint daily.  Marland Kitchen. Sexual activity: Not Currently  Other Topics Concern  . Not on file  Social History Narrative   Lives locally with two cats and a dog.  Family nearby.  Previously lived in [place].  Disabled x 13 yrs.  Has MBA in Human  Resources but says she never had an opportunity to use it.  Ambulates w/ walker.   Social Determinants of Health   Financial Resource Strain:   . Difficulty of Paying Living Expenses:   Food Insecurity:   . Worried About Programme researcher, broadcasting/film/video in the Last Year:   . Barista in the Last Year:   Transportation Needs:   . Freight forwarder (Medical):   Marland Kitchen Lack of Transportation (Non-Medical):   Physical Activity:   . Days of Exercise per Week:   . Minutes of Exercise per Session:   Stress:   . Feeling of Stress :   Social Connections:   . Frequency of Communication with Friends and Family:   . Frequency of Social Gatherings with Friends and Family:   . Attends Religious Services:   . Active Member of Clubs or Organizations:   . Attends Banker Meetings:   Marland Kitchen Marital Status:   Intimate Partner Violence:   . Fear of Current or Ex-Partner:   . Emotionally Abused:   Marland Kitchen Physically Abused:   . Sexually Abused:      Review of Systems    General:  No chills, fever, night sweats or weight changes.  Cardiovascular:  No chest pain, +++ dyspnea on  exertion, +++ LE and gut edema, +++ orthopnea, no palpitations, paroxysmal nocturnal dyspnea. Dermatological: No rash, lesions/masses Respiratory: +++ productive cough, +++ dyspnea Urologic: No hematuria, +++ dysuria prior to admission. Abdominal:   No nausea, vomiting, diarrhea, bright red blood per rectum, melena, or hematemesis Neurologic:  No visual changes, wkns, changes in mental status. All other systems reviewed and are otherwise negative except as noted above.  Physical Exam    Blood pressure 127/85, pulse 70, temperature 98.4 F (36.9 C), temperature source Oral, resp. rate 19, height 5' 2.5" (1.588 m), weight (!) 209.1 kg, SpO2 94 %.  General: Pleasant, NAD Psych: Normal affect. Neuro: Alert and oriented X 3. Moves all extremities spontaneously. HEENT: Normal  Neck: Supple, obese, difficult to gauge JVP. No bruits.. Lungs:  Resp regular and unlabored, diminished breath sounds throughout. Heart: RRR, distant no s3, s4, or murmurs. Abdomen: Obese soft, non-tender, non-distended, BS + x 4. Edema noted at bilateral flanks and along the lower border of her pannus Extremities: No clubbing, cyanosis. Trace bilateral ankle edema. DP/PT/Radials 2+ and equal bilaterally.  Labs    Cardiac Enzymes Recent Labs  Lab 05/11/20 0823 05/11/20 1107  TROPONINIHS 11 11      Lab Results  Component Value Date   WBC 13.3 (H) 05/13/2020   HGB 12.8 05/13/2020   HCT 40.9 05/13/2020   MCV 74.8 (L) 05/13/2020   PLT 307 05/13/2020    Recent Labs  Lab 05/11/20 0823 05/12/20 0554 05/16/20 0439  NA 141   < > 136  K 4.9   < > 4.8  CL 103   < > 87*  CO2 28   < > 35*  BUN 17   < > 33*  CREATININE 0.96   < > 0.92  CALCIUM 9.1   < > 8.6*  PROT 7.9  --   --   BILITOT 1.1  --   --   ALKPHOS 110  --   --   ALT 49*  --   --   AST 56*  --   --   GLUCOSE 172*   < > 229*   < > = values in this interval not displayed.    Radiology  Studies    DG Chest 1 View  Result Date:  05/16/2020 CLINICAL DATA:  Shortness of breath. EXAM: CHEST  1 VIEW COMPARISON:  May 12, 2020. FINDINGS: Stable cardiomegaly with central pulmonary vascular congestion. No pneumothorax or pleural effusion is noted. Mild left midlung subsegmental atelectasis is noted. Bony thorax is unremarkable. IMPRESSION: Stable cardiomegaly with central pulmonary vascular congestion. Mild left midlung subsegmental atelectasis. Electronically Signed   By: Lupita Raider M.D.   On: 05/16/2020 14:24   DG Chest Port 1 View  Result Date: 05/12/2020 CLINICAL DATA:  Acute respiratory failure. EXAM: PORTABLE CHEST 1 VIEW COMPARISON:  05/11/2020 FINDINGS: Cardiac enlargement and pulmonary vascular congestion is unchanged from previous exam. No pleural effusion or airspace consolidation. Platelike atelectasis in the left lower lobe appears unchanged. Visualized osseous structures are unremarkable. IMPRESSION: Stable cardiac enlargement and pulmonary vascular congestion. Electronically Signed   By: Signa Kell M.D.   On: 05/12/2020 08:14   DG Chest Portable 1 View  Result Date: 05/11/2020 CLINICAL DATA:  Shortness of breath beginning last night. EXAM: PORTABLE CHEST 1 VIEW COMPARISON:  None. FINDINGS: Lordotic technique is demonstrated. Lungs are adequately inflated demonstrate hazy prominence of the perihilar markings suggesting a degree of vascular congestion. No evidence of effusion. Minimal linear density over the lateral left midlung likely atelectasis. Cardiomegaly. Prominent overlying soft tissues. IMPRESSION: Mild cardiomegaly and findings suggesting mild interstitial edema. Minimal linear atelectasis left midlung. Electronically Signed   By: Elberta Fortis M.D.   On: 05/11/2020 09:03   ECG & Cardiac Imaging    Regular sinus rhythm, 90, right axis deviation, right atrial enlargement - personally reviewed.  Assessment & Plan    1. Acute on chronic heart failure with preserved ejection fraction: Patient previously  prescribed furosemide therapy but came off this approximately 6 months ago. She also has a history of sleep apnea but has not been using her BiPAP at home. Over the past month, she admits to more dietary indiscretion and with this, she has experienced increasing dyspnea on exertion, and lower extremity edema. She is unable to weigh herself at home. She was admitted July 24 secondary to progressive dyspnea, edema, and report of hemoptysis. She was found to be hypoxic on admission with evidence of interstitial edema on chest x-ray. She has responded well to intravenous Lasix and is -10.4 L for this admission. She continues to have abdominal edema and trace ankle edema.  Renal function has been stable.  Recommend continuation of IV diuresis while she is hospitalized.  Heart rate and blood pressure stable on diltiazem.  We discussed the importance of outpatient medication compliance.  From her perspective, this starts with improved compliance with BiPAP, as noncompliance has resulted in disrupted sleep patterns, and avoidance of diuretic therapy because it also disrupts her sleep.  I suspect she will require Lasix 40 mg daily at discharge, which was her previous home dose.  2.  Paroxysmal atrial fibrillation/flutter: Initially diagnosed in September 2020 with PAF noted on monitoring earlier this year.  She is not currently on telemetry but denies palpitations.  Heart rates have been stable based on vital signs.  Continue diltiazem.  She is anticoagulated with Xarelto and says that she has been compliant with this.  CHA2DS2-VASc equals 4.  3.  Obstructive sleep apnea/obesity hypoventilation syndrome: See #1.  She has been noncompliant with BiPAP at home in the setting of issues with her mask.  She recognizes that this resulted in significant sleep disruption which has contributed to other  issues with compliance throughout the day, as well as symptoms.  She recognizes the need for compliance and is hopeful that she  get back on the right track.  4.  AECOPD: Currently on steroids and inhalers.  No active wheezing.  Will need to continue to watch volume status on steroids.  5.  Type 2 diabetes mellitus: A1c 6.5.  Per internal medicine.  She was not previously on any medications for this.  6.  Depression: She identifies depression as a major contributor to her obesity and food addiction.  Per internal medicine.  She believes she would benefit from outpatient counseling.  7.  Severe morbid obesity: BMI of 82.97 kg/m.  Status post bariatric surgery in 2002 with approximately.  She is pretty clear that she knows what it takes to get on track, lose weight, etc.  She had a lot of success previously with regular meetings through [facility] in [place], and recognizes that she does better when support is regularly available.  With DM dx, she will now qualify for outpt nutritional counseling.  Following rehab stay, we can also look into cardiopulm rehab given COPD/CHF diagnoses.  Ultimately, referral to bariatric program in [place].  Signed, Nicolasa Ducking, NP 05/16/2020, 3:42 PM  For questions or updates, please contact   Please consult [email or web address] for contact info under Cardiology/STEMI.

## 2020-05-16 NOTE — Progress Notes (Addendum)
Date: 05/16/2020,   MRN# [id number] Alexandra Black 15-Jun-1961 Code Status:     Code Status Orders  (From admission, onward)         Start     Ordered   05/11/20 1301  Full code  Continuous        05/11/20 1302        Code Status History    This patient has a current code status but no historical code status.   Advance Care Planning Activity    Advance Directive Documentation     Most Recent Value  Type of Advance Directive Healthcare Power of Attorney  Pre-existing out of facility DNR order (yellow form or pink MOST form) --  "MOST" Form in Place? --     Hosp day:@LENGTHOFSTAYDAYS @ Referring MD: @ATDPROV @     PCP:         HPI: Asked to to see regarding pulmonary d/c plan. See was seen by me 2/21 at [facility] for her hypoventilation syndrome/osa/copd/ DM/afib/hypothyroidism and morbid obesity. She was having problems getting a fitting face mask. She is s/p gastric plasty, loss 200 lbs and regained it unfortunately. She smokes. phx of hypercapnic encephalopathy.    On 05/12/20 she was seen by Dr. 05/14/20 for recurrent hypercapnic encephalopathy. She presented with SOB x 3 days. She was also found to be lethargic. She reported an episode of non-massive hemoptysis which resolved on its own, she was on xarelto. She was noted to be hypoxemic in ED. She mentioned she was not wearing her bipap her taking her "water pill."  Her bipap DME is in [place]. Need to switch vendors.     Presently she is being transitioned to SNF. She had multiple questionws to be answered. Went over cxr with her, discussed the need to loose weight, wear her bipap nightly, and take all of her meds including the diuretic.      She also have some swallowing difficulty, speech has seen, gi eval recommended   PMHX:   Past Medical History:  Diagnosis Date  . (HFpEF) heart failure with preserved ejection fraction (HCC)    a. 06/2019 Echo ([facility]): EF 55-60%; b. 04/2020 Echo: EF 55-60%, no rwma, Gr1 DD. Nl RV  size/fxn. Mildly elev PASP. Triv MR.  . Depression   . Hypothyroidism   . Iron deficiency   . Morbid obesity (HCC)    a. s/p bariatric surgery in 2002 (pt unsure of type of surgery).  . OSA (obstructive sleep apnea)   . Osteoarthritis   . PAF (paroxysmal atrial fibrillation) (HCC)    a. 12/2019 Zio: Avg HR 84 (32-214). Sinus rhythm w/ brief runs of Afib (2% burden - avg 112 bpm, 47-187). Pauses up to 3.5 secs.  . Paroxysmal Atrial flutter (HCC)   . Vitamin B12 deficiency    Surgical Hx:  Past Surgical History:  Procedure Laterality Date  . BARIATRIC SURGERY  2002   Family Hx:  Family History  Problem Relation Age of Onset  . Heart disease Mother   . Hypertension Mother   . Peripheral Artery Disease Father   . Obesity Sister   . Obesity Brother   . Obesity Sister   . Depression Sister   . Obesity Sister    Social Hx:   Social History   Tobacco Use  . Smoking status: Current Every Day Smoker    Packs/day: 1.00  . Smokeless tobacco: Never Used  . Tobacco comment: Currently smoking 1 ppd.  Quit for 20 yrs between ages of ~  [age].  Substance Use Topics  . Alcohol use: Yes    Alcohol/week: 2.0 standard drinks    Types: 2 Glasses of wine per week  . Drug use: Yes    Types: Marijuana    Comment: 2 puffs on a joint daily.   Medication:    Home Medication:    Current Medication: @CURMEDTAB @   Allergies:  Patient has no known allergies.  Review of Systems: Gen:  Denies  fever, sweats, chills HEENT: Denies blurred vision, double vision, ear pain, eye pain, hearing loss, nose bleeds, sore throat Cvc:  No dizziness, chest pain or heaviness Resp: chronic dyspnea on exertion   Gi: Denies swallowing difficulty, stomach pain, nausea or vomiting, diarrhea, constipation, bowel incontinence Gu:  Denies bladder incontinence, burning urine Ext:   No Joint pain, stiffness or swelling Skin: No skin rash, easy bruising or bleeding or hives Endoc:  No polyuria, polydipsia ,  polyphagia or weight change Psych: No depression, insomnia or hallucinations  Other:  All other systems negative  Physical Examination:   VS: BP (!) 136/80 (BP Location: Right Arm)   Pulse 75   Temp 98.5 F (36.9 C) (Oral)   Resp 19   Ht 5' 2.5" (1.588 m)   Wt (!) 209.1 kg   SpO2 97%   BMI 82.97 kg/m   General Appearance: No distress morbidly obese Neuro: without focal findings, mental status, speech normal, alert and oriented, cranial nerves 2-12 intact, reflexes normal and symmetric, sensation grossly normal  HEENT: PERRLA, EOM intact, no ptosis, no other lesions noticed,: Neck: Short neck Pulmonary:.rare wheezing, No rales     Cardiovascular:  Normal S1,S2.       Abdomen:Benign, Soft, non-tender, unable to palpate organs, lower mid abd healed scar Endoc: No evident thyromegaly, no signs of acromegaly or Cushing features Skin:   warm, no rashes, no ecchymosis  Extremities: normal, no cyanosis, clubbing, mild edema, warm with normal capillary refill. :   Labs results:   Recent Labs    05/14/20 0433 05/15/20 0552 05/16/20 0439  BUN 34* 33* 33*  CREATININE 0.98 1.00 0.92  GLUCOSE 202* 194* 229*  CALCIUM 8.9 9.0 8.6*  ,   Rad results:   DG Chest 1 View  Result Date: 05/16/2020 CLINICAL DATA:  Shortness of breath. EXAM: CHEST  1 VIEW COMPARISON:  May 12, 2020. FINDINGS: Stable cardiomegaly with central pulmonary vascular congestion. No pneumothorax or pleural effusion is noted. Mild left midlung subsegmental atelectasis is noted. Bony thorax is unremarkable. IMPRESSION: Stable cardiomegaly with central pulmonary vascular congestion. Mild left midlung subsegmental atelectasis. Electronically Signed   By: May 14, 2020 M.D.   On: 05/16/2020 14:24    Assessment and Plan: Altered mental status with lethargy on presentation, resolved, acid base better -get best fiiting mask for her -trelegy -humidification -close out patient f/u -avoid  Sedation -will clarify any  barriers with her case manager tomorrow  COPD/OSA/OHS overlap syndrome  -trelegy   Atelectasis Incentive spirometry   Graves Disease  - continue synthroid as per home regimen  Acute on chronic diastolic CHF  -following cardiology recs  Swallowing difficulty -out patient gi eval  I have personally obtained a history, examined the patient, evaluated laboratory and imaging results, formulated the assessment and plan and placed orders.  The Patient requires high complexity decision making for assessment and support, frequent evaluation and titration of therapies, application of advanced monitoring technologies and extensive interpretation of multiple databases.   Lavern Maslow,M.D. Pulmonary & Critical care Medicine  [facility]

## 2020-05-17 DIAGNOSIS — I5033 Acute on chronic diastolic (congestive) heart failure: Secondary | ICD-10-CM

## 2020-05-17 DIAGNOSIS — J9601 Acute respiratory failure with hypoxia: Secondary | ICD-10-CM | POA: Diagnosis not present

## 2020-05-17 LAB — BASIC METABOLIC PANEL
Anion gap: 10 (ref 5–15)
BUN: 43 mg/dL — ABNORMAL HIGH (ref 6–20)
CO2: 40 mmol/L — ABNORMAL HIGH (ref 22–32)
Calcium: 8.8 mg/dL — ABNORMAL LOW (ref 8.9–10.3)
Chloride: 87 mmol/L — ABNORMAL LOW (ref 98–111)
Creatinine, Ser: 0.97 mg/dL (ref 0.44–1.00)
GFR calc Af Amer: >60 mL/min (ref >60)
GFR calc non Af Amer: >60 mL/min (ref >60)
Glucose, Bld: 162 mg/dL — ABNORMAL HIGH (ref 70–99)
Potassium: 3.7 mmol/L (ref 3.5–5.1)
Sodium: 137 mmol/L (ref 135–145)

## 2020-05-17 LAB — GLUCOSE, CAPILLARY
Glucose-Capillary: 165 mg/dL — ABNORMAL HIGH (ref 70–99)
Glucose-Capillary: 153 mg/dL — ABNORMAL HIGH (ref 70–99)

## 2020-05-17 LAB — SARS CORONAVIRUS 2 BY RT PCR (HOSPITAL ORDER, PERFORMED IN ~~LOC~~ HOSPITAL LAB): SARS Coronavirus 2: NEGATIVE

## 2020-05-17 MED ORDER — POLYETHYLENE GLYCOL 3350 17 G PO PACK: 17.0000 g | PACK | Freq: Every day | ORAL | 0 refills | Status: DC | PRN

## 2020-05-17 MED ORDER — PREDNISONE 10 MG PO TABS
ORAL_TABLET | ORAL | 0 refills | Status: DC
Start: 2020-05-17 — End: 2024-09-11

## 2020-05-17 MED ORDER — FUROSEMIDE 40 MG PO TABS
40.0000 mg | ORAL_TABLET | Freq: Every day | ORAL | 11 refills | Status: AC
Start: 2020-05-17 — End: 2024-09-11

## 2020-05-17 MED ORDER — SENNOSIDES-DOCUSATE SODIUM 8.6-50 MG PO TABS: 1.0000 | ORAL_TABLET | Freq: Two times a day (BID) | ORAL | Status: DC

## 2020-05-17 NOTE — TOC Transition Note (Signed)
Transition of Care [facility]) - CM/SW Discharge Note   Patient Details  Name: Alexandra Black MRN: [id number] Date of Birth: Sep 13, 1961  [facility] CM/SW Contact:  Allayne Butcher, RN Phone Number: 05/17/2020, 10:46 AM   Clinical Narrative:    Patient is medically stable to discharge to [facility].  Patient will transport via Customer service manager.  Bedside RN will call report to [phone].  Patient's  Insurance has approved her for NIV.  [facility] will follow patient and provide NIV at discharge from [facility].    Final next level of care: Skilled Nursing Facility Barriers to Discharge: Barriers Resolved   Patient Goals and CMS Choice Patient states their goals for this hospitalization and ongoing recovery are:: Patient wants help at home with DME, mental health, weight loss, smoking cessation and affordable home care. CMS Medicare.gov Compare Post Acute Care list provided to:: Patient Choice offered to / list presented to : Patient  Discharge Placement PASRR number recieved: 05/14/20            Patient chooses bed at: [facility] Patient to be transferred to facility by: [facility] Name of family member notified: Patient will notify family Patient and family notified of of transfer: 05/17/20  Discharge Plan and Services   Discharge Planning Services: CM Consult Post Acute Care Choice: Skilled Nursing Facility          DME Arranged: NIV (insurance auth approved- patient will receive once discharged from [facility]) DME Agency: [facility] Date DME Agency Contacted: 05/17/20 Time DME Agency Contacted: 1045 Representative spoke with at DME Agency: Oletha Cruel HH Arranged: NA          Social Determinants of Health (SDOH) Interventions     Readmission Risk Interventions No flowsheet data found.

## 2020-05-17 NOTE — Progress Notes (Signed)
Progress Note  Patient Name: Alexandra Black Date of Encounter: 05/17/2020  Primary Cardiologist: Debbe Odea, MD  Subjective   Feels about the same - no significant change in breathing.  Still coughing some.  Eager to get rehab process started.  Inpatient Medications    Scheduled Meds: . diltiazem  120 mg Oral Daily  . furosemide  40 mg Intravenous Q12H  . insulin aspart  0-15 Units Subcutaneous TID WC  . ipratropium-albuterol  3 mL Nebulization Q6H  . levothyroxine  150 mcg Oral QAC breakfast  . mouth rinse  15 mL Mouth Rinse BID  . methylPREDNISolone (SOLU-MEDROL) injection  40 mg Intravenous Daily  . potassium chloride  10 mEq Oral Daily  . rivaroxaban  20 mg Oral Q breakfast  . senna-docusate  1 tablet Oral BID  . sodium chloride flush  3 mL Intravenous Q12H  . venlafaxine XR  150 mg Oral Q breakfast   Continuous Infusions: . sodium chloride     PRN Meds: sodium chloride, acetaminophen **OR** acetaminophen, diphenhydrAMINE, guaiFENesin, ketorolac, polyethylene glycol, sodium chloride flush, sorbitol   Vital Signs    Vitals:   05/17/20 0222 05/17/20 0440 05/17/20 0718 05/17/20 0806  BP:  (!) 138/93  (!) 138/95  Pulse:  70  88  Resp:  20  18  Temp:  98 F (36.7 C)  98.3 F (36.8 C)  TempSrc:  Oral  Oral  SpO2: 99% 100% 98% 91%  Weight:      Height:        Intake/Output Summary (Last 24 hours) at 05/17/2020 0945 Last data filed at 05/16/2020 2150 Gross per 24 hour  Intake --  Output 1852 ml  Net -1852 ml   Filed Weights   05/11/20 0813 05/11/20 1800  Weight: (!) 206.8 kg (!) 209.1 kg    Physical Exam   GEN: obese, in no acute distress.  HEENT: Grossly normal.  Neck: Supple, obese - difficult to gauge jvp.  No carotid bruits, or masses. Cardiac: RRR, no murmurs, rubs, or gallops. No clubbing, cyanosis. Trace bilat ankle edema.  Radials/DP/PT 2+ and equal bilaterally.  Respiratory:  Respirations regular and unlabored, diminished breath sounds  bilaterally. GI: obese w/ flank edema and lower abd edema, BS + x 4. MS: no deformity or atrophy. Skin: warm and dry, no rash. Neuro:  Strength and sensation are intact. Psych: AAOx3.  Normal affect.  Labs    Chemistry Recent Labs  Lab 05/11/20 [phone] 05/12/20 0554 05/14/20 0433 05/15/20 0552 05/16/20 0439  NA 141   < > 139 138 136  K 4.9   < > 4.9 4.6 4.8  CL 103   < > 92* 91* 87*  CO2 28   < > 36* 36* 35*  GLUCOSE 172*   < > 202* 194* 229*  BUN 17   < > 34* 33* 33*  CREATININE 0.96   < > 0.98 1.00 0.92  CALCIUM 9.1   < > 8.9 9.0 8.6*  PROT 7.9  --   --   --   --   ALBUMIN 3.9  --   --   --   --   AST 56*  --   --   --   --   ALT 49*  --   --   --   --   ALKPHOS 110  --   --   --   --   BILITOT 1.1  --   --   --   --  GFRNONAA >60   < > >60 >60 >60  GFRAA >60   < > >60 >60 >60  ANIONGAP 10   < > 11 11 14    < > = values in this interval not displayed.     Hematology Recent Labs  Lab 05/11/20 0823 05/12/20 0554 05/13/20 0422  WBC 10.4 9.0 13.3*  RBC 5.53* 5.74* 5.47*  HGB 12.9 13.2 12.8  HCT 43.4 45.0 40.9  MCV 78.5* 78.4* 74.8*  MCH 23.3* 23.0* 23.4*  MCHC 29.7* 29.3* 31.3  RDW 17.0* 15.6* 14.9  PLT 283 303 307    Cardiac Enzymes  Recent Labs  Lab 05/11/20 0823 05/11/20 1107  TROPONINIHS 11 11      BNP Recent Labs  Lab 05/11/20 1904  BNP 129.3*     HbA1c  Lab Results  Component Value Date   HGBA1C 6.5 (H) 05/11/2020    Radiology    DG Chest 1 View  Result Date: 05/16/2020 CLINICAL DATA:  Shortness of breath. EXAM: CHEST  1 VIEW COMPARISON:  May 12, 2020. FINDINGS: Stable cardiomegaly with central pulmonary vascular congestion. No pneumothorax or pleural effusion is noted. Mild left midlung subsegmental atelectasis is noted. Bony thorax is unremarkable. IMPRESSION: Stable cardiomegaly with central pulmonary vascular congestion. Mild left midlung subsegmental atelectasis. Electronically Signed   By: May 14, 2020 M.D.   On: 05/16/2020  14:24    Telemetry    Not on tele  Cardiac Studies   2D Echocardiogram 7.25.2021  IMPRESSIONS     1. Left ventricular ejection fraction, by estimation, is 55 to 60%. The  left ventricle has normal function. The left ventricle has no regional  wall motion abnormalities. There is mild left ventricular hypertrophy.  Left ventricular diastolic parameters  are consistent with Grade I diastolic dysfunction (impaired relaxation).   2. Right ventricular systolic function is normal. The right ventricular  size is normal. There is mildly elevated pulmonary artery systolic  pressure.   3. The mitral valve is normal in structure. Trivial mitral valve  regurgitation. No evidence of mitral stenosis.   4. The aortic valve is normal in structure. Aortic valve regurgitation is  not visualized. No aortic stenosis is present.    Patient Profile     59 y.o. female with a history of paroxysmal atrial fibrillation and flutter, HFpEF, depression, morbid obesity, hypothyroidism, iron deficiency, and arthritis, who was admitted 7/24 w/ dyspnea/respfailure, and we are seeing for HFpEF.  Assessment & Plan    1.  Acute on chronic HFpEF:  In setting of noncompliance w/ outpt diuretics x several months and recent dietary indiscretions.  Echo here w/ EF 55-60%, Gr1 DD. No significant valvular abnormalities.  Has been receiving IV lasix in setting of interstitial edema on cxr. Minus 11.6L for admission.  Renal fxn stable up to this point but pending this AM.  Body habitus makes exam challenging but she cont to have trace ankle edema and abd edema.  Provided renal fxn stable, would cont IV lasix while she is in house.  If she goes to rehab to day, rec lasix 40mg  daily.  We discussed the importance of daily weights, sodium restriction, medication compliance, and symptom reporting and she verbalizes understanding.   2.  PAF/Flutter:  Flutter dx 06/2019.  2% AFib burden on Zio in March.  Regular on exam (not on  tele).  Cont dilt and xarelto (CHA2DS2VASc =  4 ).  3.  OSA/OHS:  Seen by pulm.  Was noncompliant w/ BiPAP @ home  in setting of issues w/ mask.  4.  AECOPD:  Steroids/inhalers per pulm.  5.  DMII:  A1c 6.5.  Will greatly benefit from outpt dietary counseling.  6.  Depression:  Per IM.  7.  Morbid Obesity:  BMI of 82.97 kg/m.  S/p bariatric surgery in 2002. Will benefit from nutritional counseling and referral to [facility] bariatric program in [place].     Signed, Nicolasa Ducking, NP  05/17/2020, 9:45 AM    For questions or updates, please contact   Please consult [email or web address] for contact info under Cardiology/STEMI.

## 2020-05-17 NOTE — Discharge Summary (Signed)
Physician Discharge Summary  Alexandra Black FXT:[id number] DOB: Oct 24, 1960 DOA: 05/11/2020  PCP: Franciso Bend, NP  Admit date: 05/11/2020 Discharge date: 05/17/2020  Admitted From:home Disposition:  snf Recommendations for Outpatient Follow-up:  1. Follow up with PCP in 1-2 weeks 2. Please obtain BMP/CBC in one week 3. Follow-up with cardiology 4. Follow-up with pulmonary 5. Patient may need referral to bariatric surgery at [facility] and referral for GI evaluation for dysphagia and chronic globus sensation.  Home Health:none Equipment/Devices:none  Discharge Condition:imrpoved and stable CODE STATUS:full Diet recommendation:cardiac Brief/Interim Summary:59 y.o.femalewith past medical history significant for morbid obesity BMI greater than 70, COPD/chronic bronchitis, OSA, DM 2, probable congestive heart failure, who recently moved to this area from [place] and has had difficulty getting her CPAP mask and medications filled since her move 3 months ago. Patient was in her usual state of health until 2 to 3 days ago when she complained of new hemoptysis and increasing shortness of breath as well as dysuria.  Per patient's sister who is her power of attorney and her caregiver, patient become short of breath with speaking and with eating. She is however able to walk across the room and to the kitchen using a walker at baseline. Since her move patient has had decreasing functional capacity. Also of note patient is supposed to have a home health aide to help with her personal hygiene and apparently the home health aide has not been coming so patient has been incontinent of urine and sitting in her urine. Patient sister advised her to come to the ED when she complained of dysuria and increasing shortness of breath yesterday.  Patient sister and daughter do not think that patient has had fevers. Patient's daughter did see the hemoptysis and thought that the phlegm was faintly pink. She  herself attributed it to patient's coughing. Of note patient is on Xarelto however it is unclear whether or not patient has been taking the Xarelto as prescribed as patient usually manages her own medications and it is unclear whether she has been getting them filled. Patient's daughter states that she has not been taking her "water pills". Of note patient had been admitted to a hospital in [place] 1 year ago with similar symptoms and was possibly diagnosed with congestive heart failure but it is unclear. Patient has never had a heart attack as far as her sister and daughter know.  The patient was noted to be hypoxic with O2 saturations in the mid 70s on room air. Chest x-ray done showed interstitial infiltrates consistent with pulmonary edema. She was given 60 mg Lasix IV and has put out 300 cc so far. She was initially started on CPAP however continued to desaturate and was transitioned to BiPAP. Discharge Diagnoses:  Principal Problem:   Acute respiratory failure with hypoxia (HCC) Active Problems:   OSA (obstructive sleep apnea)   Atrial flutter (HCC)   Depression   Hypothyroidism   COPD (chronic obstructive pulmonary disease) (HCC)   Morbid obesity with BMI of 70 and over, adult (HCC)   GERD (gastroesophageal reflux disease)   Type 2 diabetes mellitus without complication (HCC)     Acute on chronic hypercapnic respiratory failure Patient presented with confusion.  Found to have elevated PaCO2 of 81.  Has been noncompliant with her home BiPAP versus CPAP secondary to issues with her "mask".  This is complicated by her severe morbid obesity with a BMI of 83 with likely underlying obesity hypoventilation syndrome/obstructive sleep apnea.TOC assisted with obtaining a trilogy ventilator  for discharge to skilled nursing facility.SHE MUST USE TRILEGY QHS AND PRN  Acute COPD exacerbation-she was treated with IV steroids.  Patient will be discharged on prednisone taper.   Obstructive  sleep apnea Obesity hypoventilation syndrome BMI 83.97.  Previously on BiPAP/CPAP at home but issues with device. --Continue BiPAP during the day while sleeping and nightly.  Acute on chronic diastolic congestive heart failure- Recently moved to the area from [place]. TTE with EF 55-60%, no LV regional wall motion abnormalities, mild LVH, grade 1 diastolic dysfunction. Was on Lasix 40 mg daily at home which she has not been taking.  She was treated with IV Lasix during the hospital stay.  Her renal functions were stable with diuresis.  On the day of discharge she is negative by 11.6 L. We will discharge her on Lasix 40 mg daily  Hypothyroidism --Continue levothyroxine 150 mcg p.o. daily  Paroxysmal atrial fibrillation --Continue Cardizem 120 mg p.o. daily --Continue anticoagulation with Xarelto  Type 2 diabetes mellitus Reportedly not taking any medications at home.  Hemoglobin A1c 6.5.  Depression --Continue venlafaxine 150 mg p.o. daily  Severe morbid obesity Body mass index is 82.97 kg/m.  Patient previously underwent weight loss surgery with apparent banding at [facility] in [place].  Previously follows with bariatric surgery but has been lost to follow-up since 2018; in which she has had significant weight gain since.  Discussed with patient need for aggressive weight loss measures and lifestyle changes as this complicates all facets of care.    Will refer her to outpatient [facility] at [facility].  Weakness, deconditioning: PT/OT with recommendations of SNF placement.  Patient in agreement.   Chronic dysphagia with globus sensation will need GI work-up as an outpatient.  Estimated body mass index is 82.97 kg/m as calculated from the following:   Height as of this encounter: 5' 2.5" (1.588 m).   Weight as of this encounter: 209.1 kg.  Discharge Instructions  Discharge Instructions    Diet - low sodium heart healthy   Complete by: As directed    Increase  activity slowly   Complete by: As directed      Allergies as of 05/17/2020   No Known Allergies     Medication List    STOP taking these medications   allopurinol 100 MG tablet Commonly known as: ZYLOPRIM   Biotin 1000 MCG tablet   metFORMIN 500 MG tablet Commonly known as: GLUCOPHAGE     TAKE these medications   b complex vitamins tablet Take 1 tablet by mouth daily.   B-12 5000 MCG Caps Take by mouth daily.   Cholecalciferol 25 MCG (1000 UT) capsule Take 1,000 Units by mouth daily.   diltiazem 120 MG tablet Commonly known as: CARDIZEM Take 120 mg by mouth daily.   ELDERBERRY PO Take by mouth daily.   furosemide 40 MG tablet Commonly known as: Lasix Take 1 tablet (40 mg total) by mouth daily.   guaiFENesin 600 MG 12 hr tablet Commonly known as: MUCINEX Take 600-1,200 mg by mouth 2 (two) times daily as needed.   ipratropium-albuterol 0.5-2.5 (3) MG/3ML Soln Commonly known as: DUONEB Take 3 mLs by nebulization.   levothyroxine 150 MCG tablet Commonly known as: SYNTHROID Take 150 mcg by mouth daily before breakfast.   MULTIVITAMIN ADULT PO Take by mouth daily.   polyethylene glycol 17 g packet Commonly known as: MIRALAX / GLYCOLAX Take 17 g by mouth daily as needed for mild constipation.   potassium chloride 10 MEQ CR capsule  Commonly known as: MICRO-K Take 10 mEq by mouth daily.   predniSONE 10 MG tablet Commonly known as: DELTASONE Take 4 tablets daily for 3 days  Then 3 tablets daily for 3 days  Then 2 tablets daily for 3 days then 1 tablet daily  Till done   rivaroxaban 20 MG Tabs tablet Commonly known as: XARELTO Take 20 mg by mouth daily with supper.   senna-docusate 8.6-50 MG tablet Commonly known as: Senokot-S Take 1 tablet by mouth 2 (two) times daily.   venlafaxine XR 150 MG 24 hr capsule Commonly known as: EFFEXOR-XR Take 150 mg by mouth daily with breakfast.       Contact information for follow-up providers    Franciso Bend, NP Follow up.   Specialty: Nurse Practitioner Contact information: [address] [place] [address] [phone]        Debbe Odea, MD .   Specialties: Cardiology, Radiology Contact information: [address] [place] [address] [phone]        Mertie Moores, MD Follow up.   Specialty: Specialist Contact information: [address] [place] [address] [phone]            Contact information for after-discharge care    Destination    [facility] Preferred SNF .   Service: Skilled Nursing Contact information: [address] [place] [address] [phone]                 No Known Allergies  Consultations: Cardiology and pulmonary     Procedures/Studies: DG Chest 1 View  Result Date: 05/16/2020 CLINICAL DATA:  Shortness of breath. EXAM: CHEST  1 VIEW COMPARISON:  May 12, 2020. FINDINGS: Stable cardiomegaly with central pulmonary vascular congestion. No pneumothorax or pleural effusion is noted. Mild left midlung subsegmental atelectasis is noted. Bony thorax is unremarkable. IMPRESSION: Stable cardiomegaly with central pulmonary vascular congestion. Mild left midlung subsegmental atelectasis. Electronically Signed   By: Lupita Raider M.D.   On: 05/16/2020 14:24   DG Chest Port 1 View  Result Date: 05/12/2020 CLINICAL DATA:  Acute respiratory failure. EXAM: PORTABLE CHEST 1 VIEW COMPARISON:  05/11/2020 FINDINGS: Cardiac enlargement and pulmonary vascular congestion is unchanged from previous exam. No pleural effusion or airspace consolidation. Platelike atelectasis in the left lower lobe appears unchanged. Visualized osseous structures are unremarkable. IMPRESSION: Stable cardiac enlargement and pulmonary vascular congestion. Electronically Signed   By: Signa Kell M.D.   On: 05/12/2020 08:14   DG Chest Portable 1 View  Result Date: 05/11/2020 CLINICAL DATA:   Shortness of breath beginning last night. EXAM: PORTABLE CHEST 1 VIEW COMPARISON:  None. FINDINGS: Lordotic technique is demonstrated. Lungs are adequately inflated demonstrate hazy prominence of the perihilar markings suggesting a degree of vascular congestion. No evidence of effusion. Minimal linear density over the lateral left midlung likely atelectasis. Cardiomegaly. Prominent overlying soft tissues. IMPRESSION: Mild cardiomegaly and findings suggesting mild interstitial edema. Minimal linear atelectasis left midlung. Electronically Signed   By: Elberta Fortis M.D.   On: 05/11/2020 09:03   ECHOCARDIOGRAM COMPLETE  Result Date: 05/13/2020    ECHOCARDIOGRAM REPORT   Patient Name:   KOURTNEY TERRIQUEZ Date of Exam: 05/12/2020 Medical Rec #:  [id number]        Height:       62.5 in Accession #:    [id number]       Weight:       461.0 lb Date of Birth:  28-Apr-1961  BSA:          2.742 m Patient Age:    59 years         BP:           125/92 mmHg Patient Gender: F                HR:           88 bpm. Exam Location:  [facility] Procedure: 2D Echo, Cardiac Doppler and Color Doppler Indications:     Dyspnea 786.09 / R06.00  History:         Patient has no prior history of Echocardiogram examinations.                  Arrythmias:Atrial Flutter.  Sonographer:     Neysa Bonito Roar Referring Phys:  [id number] Raynaldo Opitz CHATTERJEE Diagnosing Phys: Lorine Bears MD IMPRESSIONS  1. Left ventricular ejection fraction, by estimation, is 55 to 60%. The left ventricle has normal function. The left ventricle has no regional wall motion abnormalities. There is mild left ventricular hypertrophy. Left ventricular diastolic parameters are consistent with Grade I diastolic dysfunction (impaired relaxation).  2. Right ventricular systolic function is normal. The right ventricular size is normal. There is mildly elevated pulmonary artery systolic pressure.  3. The mitral valve is normal in structure. Trivial mitral valve regurgitation. No  evidence of mitral stenosis.  4. The aortic valve is normal in structure. Aortic valve regurgitation is not visualized. No aortic stenosis is present. FINDINGS  Left Ventricle: Left ventricular ejection fraction, by estimation, is 55 to 60%. The left ventricle has normal function. The left ventricle has no regional wall motion abnormalities. The left ventricular internal cavity size was normal in size. There is  mild left ventricular hypertrophy. Left ventricular diastolic parameters are consistent with Grade I diastolic dysfunction (impaired relaxation). Right Ventricle: The right ventricular size is normal. No increase in right ventricular wall thickness. Right ventricular systolic function is normal. There is mildly elevated pulmonary artery systolic pressure. The tricuspid regurgitant velocity is 3.01  m/s, and with an assumed right atrial pressure of 5 mmHg, the estimated right ventricular systolic pressure is 41.2 mmHg. Left Atrium: Left atrial size was normal in size. Right Atrium: Right atrial size was normal in size. Pericardium: There is no evidence of pericardial effusion. Mitral Valve: The mitral valve is normal in structure. Normal mobility of the mitral valve leaflets. Trivial mitral valve regurgitation. No evidence of mitral valve stenosis. Tricuspid Valve: The tricuspid valve is normal in structure. Tricuspid valve regurgitation is trivial. No evidence of tricuspid stenosis. Aortic Valve: The aortic valve is normal in structure. Aortic valve regurgitation is not visualized. No aortic stenosis is present. Aortic valve mean gradient measures 4.0 mmHg. Aortic valve peak gradient measures 8.4 mmHg. Aortic valve area, by VTI measures 2.56 cm. Pulmonic Valve: The pulmonic valve was normal in structure. Pulmonic valve regurgitation is not visualized. No evidence of pulmonic stenosis. Aorta: The aortic root is normal in size and structure. Venous: The inferior vena cava was not well visualized. IAS/Shunts:  No atrial level shunt detected by color flow Doppler.  LEFT VENTRICLE PLAX 2D LVIDd:         4.44 cm  Diastology LVIDs:         3.17 cm  LV e' lateral:   8.81 cm/s LV PW:         1.21 cm  LV E/e' lateral: 7.7 LV IVS:        1.31 cm  LV e' medial:    7.18 cm/s LVOT diam:     1.80 cm  LV E/e' medial:  9.4 LV SV:         53 LV SV Index:   19 LVOT Area:     2.54 cm  RIGHT VENTRICLE RV Mid diam:    3.50 cm RV S prime:     21.60 cm/s TAPSE (M-mode): 2.1 cm LEFT ATRIUM             Index       RIGHT ATRIUM           Index LA diam:        3.50 cm 1.28 cm/m  RA Area:     12.60 cm LA Vol (A2C):   67.2 ml 24.50 ml/m RA Volume:   27.00 ml  9.85 ml/m LA Vol (A4C):   45.5 ml 16.59 ml/m LA Biplane Vol: 57.9 ml 21.11 ml/m  AORTIC VALVE                   PULMONIC VALVE AV Area (Vmax):    2.21 cm    PV Vmax:        1.15 m/s AV Area (Vmean):   1.95 cm    PV Peak grad:   5.3 mmHg AV Area (VTI):     2.56 cm    RVOT Peak grad: 2 mmHg AV Vmax:           145.00 cm/s AV Vmean:          95.200 cm/s AV VTI:            0.207 m AV Peak Grad:      8.4 mmHg AV Mean Grad:      4.0 mmHg LVOT Vmax:         126.00 cm/s LVOT Vmean:        72.900 cm/s LVOT VTI:          0.208 m LVOT/AV VTI ratio: 1.00  AORTA Ao Root diam: 2.70 cm MITRAL VALVE               TRICUSPID VALVE MV Area (PHT): 3.60 cm    TR Peak grad:   36.2 mmHg MV Decel Time: 211 msec    TR Vmax:        301.00 cm/s MV E velocity: 67.40 cm/s MV A velocity: 97.50 cm/s  SHUNTS MV E/A ratio:  0.69        Systemic VTI:  0.21 m MV A Prime:    12.1 cm/s   Systemic Diam: 1.80 cm Lorine Bears MD Electronically signed by Lorine Bears MD Signature Date/Time: 05/13/2020/9:10:29 AM    Final     (Echo, Carotid, EGD, Colonoscopy, ERCP)    Subjective:  She is sitting up by the side of the bed without the oxygen getting cleaned up denies any new complaints  Discharge Exam: Vitals:   05/17/20 0718 05/17/20 0806  BP:  (!) 138/95  Pulse:  88  Resp:  18  Temp:  98.3 F (36.8 C)   SpO2: 98% 91%   Vitals:   05/17/20 0222 05/17/20 0440 05/17/20 0718 05/17/20 0806  BP:  (!) 138/93  (!) 138/95  Pulse:  70  88  Resp:  20  18  Temp:  98 F (36.7 C)  98.3 F (36.8 C)  TempSrc:  Oral  Oral  SpO2: 99% 100% 98% 91%  Weight:      Height:  General: Pt is alert, awake, not in acute distress Cardiovascular: RRR, S1/S2 +, no rubs, no gallops Respiratory:  Rhonchi bilaterally, no wheezing, no rhonchi Abdominal: Soft, NT, ND, bowel sounds + Extremities:one plus edema, no cyanosis    The results of significant diagnostics from this hospitalization (including imaging, microbiology, ancillary and laboratory) are listed below for reference.     Microbiology: Recent Results (from the past 240 hour(s))  SARS Coronavirus 2 by RT PCR (hospital order, performed in [facility] hospital lab) Nasopharyngeal Nasopharyngeal Swab     Status: None   Collection Time: 05/11/20  8:52 AM   Specimen: Nasopharyngeal Swab  Result Value Ref Range Status   SARS Coronavirus 2 NEGATIVE NEGATIVE Final    Comment: (NOTE) SARS-CoV-2 target nucleic acids are NOT DETECTED.  The SARS-CoV-2 RNA is generally detectable in upper and lower respiratory specimens during the acute phase of infection. The lowest concentration of SARS-CoV-2 viral copies this assay can detect is 250 copies / mL. A negative result does not preclude SARS-CoV-2 infection and should not be used as the sole basis for treatment or other patient management decisions.  A negative result may occur with improper specimen collection / handling, submission of specimen other than nasopharyngeal swab, presence of viral mutation(s) within the areas targeted by this assay, and inadequate number of viral copies (<250 copies / mL). A negative result must be combined with clinical observations, patient history, and epidemiological information.  Fact Sheet for Patients:   BoilerBrush.com.cy  Fact Sheet for  Healthcare Providers: https://pope.com/  This test is not yet approved or  cleared by the [place] FDA and has been authorized for detection and/or diagnosis of SARS-CoV-2 by FDA under an Emergency Use Authorization (EUA).  This EUA will remain in effect (meaning this test can be used) for the duration of the COVID-19 declaration under Section 564(b)(1) of the Act, 21 U.S.C. section 360bbb-3(b)(1), unless the authorization is terminated or revoked sooner.  Performed at [facility], [address] [place], [place] [address]   MRSA PCR Screening     Status: None   Collection Time: 05/11/20  7:50 PM   Specimen: Nasopharyngeal  Result Value Ref Range Status   MRSA by PCR NEGATIVE NEGATIVE Final    Comment:        The GeneXpert MRSA Assay (FDA approved for NASAL specimens only), is one component of a comprehensive MRSA colonization surveillance program. It is not intended to diagnose MRSA infection nor to guide or monitor treatment for MRSA infections. Performed at [facility], [address] [place], [place] [address]      Labs: BNP (last 3 results) Recent Labs    05/11/20 1904  BNP 129.3*   Basic Metabolic Panel: Recent Labs  Lab 05/12/20 0554 05/13/20 0422 05/14/20 0433 05/15/20 0552 05/16/20 0439  NA 142 141 139 138 136  K 4.4 4.5 4.9 4.6 4.8  CL 96* 94* 92* 91* 87*  CO2 36* 34* 36* 36* 35*  GLUCOSE 171* 197* 202* 194* 229*  BUN 15 31* 34* 33* 33*  CREATININE 0.96 1.09* 0.98 1.00 0.92  CALCIUM 9.4 8.7* 8.9 9.0 8.6*  MG 1.9 2.4  --   --   --   PHOS 4.3  --   --   --   --    Liver Function Tests: Recent Labs  Lab 05/11/20 0823  AST 56*  ALT 49*  ALKPHOS 110  BILITOT 1.1  PROT 7.9  ALBUMIN 3.9   No results  for input(s): LIPASE, AMYLASE in the last 168 hours. No results for input(s): AMMONIA in the last 168 hours. CBC: Recent Labs  Lab 05/11/20 0823 05/12/20 0554 05/13/20 0422  WBC 10.4 9.0  13.3*  HGB 12.9 13.2 12.8  HCT 43.4 45.0 40.9  MCV 78.5* 78.4* 74.8*  PLT 283 303 307   Cardiac Enzymes: No results for input(s): CKTOTAL, CKMB, CKMBINDEX, TROPONINI in the last 168 hours. BNP: Invalid input(s): POCBNP CBG: Recent Labs  Lab 05/16/20 0808 05/16/20 1147 05/16/20 1603 05/16/20 2116 05/17/20 0806  GLUCAP 204* 215* 199* 225* 165*   D-Dimer No results for input(s): DDIMER in the last 72 hours. Hgb A1c No results for input(s): HGBA1C in the last 72 hours. Lipid Profile No results for input(s): CHOL, HDL, LDLCALC, TRIG, CHOLHDL, LDLDIRECT in the last 72 hours. Thyroid function studies No results for input(s): TSH, T4TOTAL, T3FREE, THYROIDAB in the last 72 hours.  Invalid input(s): FREET3 Anemia work up No results for input(s): VITAMINB12, FOLATE, FERRITIN, TIBC, IRON, RETICCTPCT in the last 72 hours. Urinalysis    Component Value Date/Time   COLORURINE STRAW (A) 05/11/2020 1250   APPEARANCEUR CLEAR (A) 05/11/2020 1250   LABSPEC 1.006 05/11/2020 1250   PHURINE 5.0 05/11/2020 1250   GLUCOSEU NEGATIVE 05/11/2020 1250   HGBUR SMALL (A) 05/11/2020 1250   BILIRUBINUR NEGATIVE 05/11/2020 1250   KETONESUR NEGATIVE 05/11/2020 1250   PROTEINUR NEGATIVE 05/11/2020 1250   NITRITE NEGATIVE 05/11/2020 1250   LEUKOCYTESUR TRACE (A) 05/11/2020 1250   Sepsis Labs Invalid input(s): PROCALCITONIN,  WBC,  LACTICIDVEN Microbiology Recent Results (from the past 240 hour(s))  SARS Coronavirus 2 by RT PCR (hospital order, performed in [facility] hospital lab) Nasopharyngeal Nasopharyngeal Swab     Status: None   Collection Time: 05/11/20  8:52 AM   Specimen: Nasopharyngeal Swab  Result Value Ref Range Status   SARS Coronavirus 2 NEGATIVE NEGATIVE Final    Comment: (NOTE) SARS-CoV-2 target nucleic acids are NOT DETECTED.  The SARS-CoV-2 RNA is generally detectable in upper and lower respiratory specimens during the acute phase of infection. The lowest concentration of  SARS-CoV-2 viral copies this assay can detect is 250 copies / mL. A negative result does not preclude SARS-CoV-2 infection and should not be used as the sole basis for treatment or other patient management decisions.  A negative result may occur with improper specimen collection / handling, submission of specimen other than nasopharyngeal swab, presence of viral mutation(s) within the areas targeted by this assay, and inadequate number of viral copies (<250 copies / mL). A negative result must be combined with clinical observations, patient history, and epidemiological information.  Fact Sheet for Patients:   BoilerBrush.com.cy  Fact Sheet for Healthcare Providers: https://pope.com/  This test is not yet approved or  cleared by the [place] FDA and has been authorized for detection and/or diagnosis of SARS-CoV-2 by FDA under an Emergency Use Authorization (EUA).  This EUA will remain in effect (meaning this test can be used) for the duration of the COVID-19 declaration under Section 564(b)(1) of the Act, 21 U.S.C. section 360bbb-3(b)(1), unless the authorization is terminated or revoked sooner.  Performed at [facility], [address] [place], [place] [address]   MRSA PCR Screening     Status: None   Collection Time: 05/11/20  7:50 PM   Specimen: Nasopharyngeal  Result Value Ref Range Status   MRSA by PCR NEGATIVE NEGATIVE Final    Comment:        The  GeneXpert MRSA Assay (FDA approved for NASAL specimens only), is one component of a comprehensive MRSA colonization surveillance program. It is not intended to diagnose MRSA infection nor to guide or monitor treatment for MRSA infections. Performed at [facility], [address], [place], [place] [address]      Time coordinating discharge: 39 minutes  SIGNED:   Alwyn Ren, MD  [facility] 05/17/2020, 10:23 AM Pager   If  7PM-7AM, please contact night-coverage [email or web address] Password [email or web address]

## 2020-05-17 NOTE — TOC Progression Note (Addendum)
Transition of Care [facility]) - Progression Note    Patient Details  Name: Alexandra Black MRN: [id number] Date of Birth: [birth year]  Transition of Care [facility]) CM/SW Contact  Allayne Butcher, RN Phone Number: 05/17/2020, 12:42 PM  Clinical Narrative:     [facility] transport has been arranged, patient is 2nd on the list for Pick up.  She is going to room 36B at [facility].   Expected Discharge Plan: Skilled Nursing Facility Barriers to Discharge: Barriers Resolved  Expected Discharge Plan and Services Expected Discharge Plan: Skilled Nursing Facility   Discharge Planning Services: CM Consult Post Acute Care Choice: Skilled Nursing Facility Living arrangements for the past 2 months: Apartment Expected Discharge Date: 05/17/20               DME Arranged: NIV (insurance auth approved- patient will receive once discharged from [facility]) DME Agency: [employer] Date DME Agency Contacted: 05/17/20 Time DME Agency Contacted: 1045 Representative spoke with at DME Agency: Oletha Cruel HH Arranged: NA           Social Determinants of Health (SDOH) Interventions    Readmission Risk Interventions No flowsheet data found.

## 2020-05-17 NOTE — Progress Notes (Signed)
Report called to Revonda Standard, Charity fundraiser at [facility].

## 2020-05-20 NOTE — Telephone Encounter (Signed)
Patient discharged 7/30.

## 2020-05-30 ENCOUNTER — Encounter: Payer: Self-pay | Admitting: Cardiology

## 2020-05-30 ENCOUNTER — Ambulatory Visit: Payer: Medicare Other | Admitting: Cardiology

## 2020-05-30 ENCOUNTER — Other Ambulatory Visit: Payer: Self-pay

## 2020-05-30 VITALS — BP 120/90 | HR 90 | Ht 62.5 in | Wt >= 6400 oz

## 2020-05-30 DIAGNOSIS — R600 Localized edema: Secondary | ICD-10-CM

## 2020-05-30 DIAGNOSIS — I10 Essential (primary) hypertension: Secondary | ICD-10-CM | POA: Diagnosis not present

## 2020-05-30 DIAGNOSIS — F17200 Nicotine dependence, unspecified, uncomplicated: Secondary | ICD-10-CM

## 2020-05-30 DIAGNOSIS — I480 Paroxysmal atrial fibrillation: Secondary | ICD-10-CM

## 2020-05-30 DIAGNOSIS — E119 Type 2 diabetes mellitus without complications: Secondary | ICD-10-CM

## 2020-05-30 NOTE — Patient Instructions (Addendum)
Medication Instructions:  Your physician recommends that you continue on your current medications as directed. Please refer to the Current Medication list given to you today. *If you need a refill on your cardiac medications before your next appointment, please call your pharmacy*   Lab Work: None Ordered If you have labs (blood work) drawn today and your tests are completely normal, you will receive your results only by: Marland Kitchen MyChart Message (if you have MyChart) OR . A paper copy in the mail If you have any lab test that is abnormal or we need to change your treatment, we will call you to review the results.   Testing/Procedures: None Ordered   Follow-Up: At [facility], you and your health needs are our priority.  As part of our continuing mission to provide you with exceptional heart care, we have created designated Provider Care Teams.  These Care Teams include your primary Cardiologist (physician) and Advanced Practice Providers (APPs -  Physician Assistants and Nurse Practitioners) who all work together to provide you with the care you need, when you need it.  We recommend signing up for the patient portal called "MyChart".  Sign up information is provided on this After Visit Summary.  MyChart is used to connect with patients for Virtual Visits (Telemedicine).  Patients are able to view lab/test results, encounter notes, upcoming appointments, etc.  Non-urgent messages can be sent to your provider as well.   To learn more about what you can do with MyChart, go to [email or web address].    Your next appointment:   3 month(s)  The format for your next appointment:   In Person  Provider:      Other Instructions  Referral to Nutrition

## 2020-05-30 NOTE — Progress Notes (Signed)
Cardiology Office Note:    Date:  05/30/2020   ID:  Alexandra FortsSheneta Girardin, DOB October 09, 1961, MRN [id number]  PCP:  Franciso Bendogers, Jennifer B, NP  Cardiologist:  Debbe OdeaBrian Agbor-Etang, MD  Electrophysiologist:  None   Referring MD: Franciso Bendogers, Jennifer B, NP   Chief Complaint  Patient presents with  . Other    Hospital follow up. Patient c/o swelling in ankles. meds reviewed verbally with patient.     History of Present Illness:    Alexandra Black is a 59 y.o. female with a hx of atrial flutter, morbid obesity, OSA, current smoker x25+ years, who presents for follow-up.  2-week cardiac monitor was placed after last visit to evaluate A. fib/atrial flutter presence and burden.  Since last visit, patient was hospitalized on 05/11/2020 for 6 days due to shortness of breath and hypoxia, diagnosed with hypercapnic respiratory failure needing BiPAP.  She was treated with IV steroids and discharged on steroid taper.  Patient states not being compliant with her medications including Lasix, also not compliant with CPAP for over 6 months.  She would like to see a nutritionist to help her with weight loss.  Prior notes  patient used to live in [place] but recently moved to the area.  She was admitted to the hospital/[facility] in September/2020, diagnosed with hypoxic respiratory failure.  While hospitalized, she was noted to be in atrial flutter.  She was started on Cardizem and Xarelto 20 mg daily.  Echocardiogram obtained during hospitalization showed normal ejection fraction with EF 55 to 60%.  Study was difficult due to morbid obesity.  Past Medical History:  Diagnosis Date  . (HFpEF) heart failure with preserved ejection fraction (HCC)    a. 06/2019 Echo ([facility]): EF 55-60%; b. 04/2020 Echo: EF 55-60%, no rwma, Gr1 DD. Nl RV size/fxn. Mildly elev PASP. Triv MR.  . Depression   . Hypothyroidism   . Iron deficiency   . Morbid obesity (HCC)    a. s/p bariatric surgery in 2002 (pt unsure of type of surgery).  .  OSA (obstructive sleep apnea)   . Osteoarthritis   . PAF (paroxysmal atrial fibrillation) (HCC)    a. 12/2019 Zio: Avg HR 84 (32-214). Sinus rhythm w/ brief runs of Afib (2% burden - avg 112 bpm, 47-187). Pauses up to 3.5 secs.  . Paroxysmal Atrial flutter (HCC)   . Vitamin B12 deficiency     Past Surgical History:  Procedure Laterality Date  . BARIATRIC SURGERY  2002    Current Medications: Current Meds  Medication Sig  . b complex vitamins tablet Take 1 tablet by mouth daily.  . Cholecalciferol 25 MCG (1000 UT) capsule Take 1,000 Units by mouth daily.  . Cyanocobalamin (B-12) 5000 MCG CAPS Take by mouth daily.  Marland Kitchen. diltiazem (CARDIZEM) 120 MG tablet Take 120 mg by mouth daily.  Marland Kitchen. ELDERBERRY PO Take by mouth daily.  . furosemide (LASIX) 40 MG tablet Take 1 tablet (40 mg total) by mouth daily.  Marland Kitchen. guaiFENesin (MUCINEX) 600 MG 12 hr tablet Take 600-1,200 mg by mouth 2 (two) times daily as needed.  Marland Kitchen. ipratropium-albuterol (DUONEB) 0.5-2.5 (3) MG/3ML SOLN Take 3 mLs by nebulization.  Marland Kitchen. levothyroxine (SYNTHROID) 150 MCG tablet Take 150 mcg by mouth daily before breakfast.  . metFORMIN (GLUCOPHAGE) 500 MG tablet Take 500 mg by mouth 2 (two) times daily with a meal.  . Multiple Vitamin (MULTIVITAMIN ADULT PO) Take by mouth daily.  . polyethylene glycol (MIRALAX / GLYCOLAX) 17 g packet Take 17 g by mouth  daily as needed for mild constipation.  . potassium chloride (MICRO-K) 10 MEQ CR capsule Take 10 mEq by mouth daily.  . predniSONE (DELTASONE) 10 MG tablet Take 4 tablets daily for 3 days  Then 3 tablets daily for 3 days  Then 2 tablets daily for 3 days then 1 tablet daily  Till done  . rivaroxaban (XARELTO) 20 MG TABS tablet Take 20 mg by mouth daily with supper.  . senna-docusate (SENOKOT-S) 8.6-50 MG tablet Take 1 tablet by mouth 2 (two) times daily.  Marland Kitchen venlafaxine XR (EFFEXOR-XR) 150 MG 24 hr capsule Take 150 mg by mouth daily with breakfast.     Allergies:   Patient has no known  allergies.   Social History   Socioeconomic History  . Marital status: Divorced    Spouse name: Not on file  . Number of children: Not on file  . Years of education: Not on file  . Highest education level: Not on file  Occupational History  . Not on file  Tobacco Use  . Smoking status: Current Every Day Smoker    Packs/day: 1.00  . Smokeless tobacco: Never Used  . Tobacco comment: Currently smoking 1 ppd.  Quit for 20 yrs between ages of ~ [age].  Substance and Sexual Activity  . Alcohol use: Yes    Alcohol/week: 2.0 standard drinks    Types: 2 Glasses of wine per week  . Drug use: Yes    Types: Marijuana    Comment: 2 puffs on a joint daily.  Marland Kitchen Sexual activity: Not Currently  Other Topics Concern  . Not on file  Social History Narrative   Lives locally with two cats and a dog.  Family nearby.  Previously lived in [place].  Disabled x 13 yrs.  Has MBA in [employer] but says she never had an opportunity to use it.  Ambulates w/ walker.   Social Determinants of Health   Financial Resource Strain:   . Difficulty of Paying Living Expenses:   Food Insecurity:   . Worried About Programme researcher, broadcasting/film/video in the Last Year:   . Barista in the Last Year:   Transportation Needs:   . Freight forwarder (Medical):   Marland Kitchen Lack of Transportation (Non-Medical):   Physical Activity:   . Days of Exercise per Week:   . Minutes of Exercise per Session:   Stress:   . Feeling of Stress :   Social Connections:   . Frequency of Communication with Friends and Family:   . Frequency of Social Gatherings with Friends and Family:   . Attends Religious Services:   . Active Member of Clubs or Organizations:   . Attends Banker Meetings:   Marland Kitchen Marital Status:      Family History: The patient's family history includes Depression in her sister; Heart disease in her mother; Hypertension in her mother; Obesity in her brother, sister, sister, and sister; Peripheral Artery  Disease in her father.  ROS:   Please see the history of present illness.     All other systems reviewed and are negative.  EKGs/Labs/Other Studies Reviewed:    The following studies were reviewed today:   EKG:  EKG not  ordered today.   Recent Labs: 05/11/2020: ALT 49; B Natriuretic Peptide 129.3; TSH 3.343 05/13/2020: Hemoglobin 12.8; Magnesium 2.4; Platelets 307 05/17/2020: BUN 43; Creatinine, Ser 0.97; Potassium 3.7; Sodium 137  Recent Lipid Panel No results found for: CHOL, TRIG, HDL, CHOLHDL, VLDL, LDLCALC, LDLDIRECT  Physical Exam:    VS:  BP 120/90 (BP Location: Right Arm, Patient Position: Sitting, Cuff Size: Normal)   Pulse 90   Ht 5' 2.5" (1.588 m)   Wt (!) 449 lb (203.7 kg)   SpO2 97%   BMI 80.81 kg/m     Wt Readings from Last 3 Encounters:  05/30/20 (!) 449 lb (203.7 kg)  05/11/20 (!) 460 lb 15.7 oz (209.1 kg)  02/28/20 (!) 455 lb 14.4 oz (206.8 kg)     GEN:  Well nourished, well developed in no acute distress, obese HEENT: Normal NECK: No JVD; No carotid bruits LYMPHATICS: No lymphadenopathy CARDIAC: RRR, no murmurs, rubs, gallops RESPIRATORY:  Clear to auscultation without rales, wheezing or rhonchi  ABDOMEN: Soft, non-tender, distended MUSCULOSKELETAL:  2+ edema; No deformity  SKIN: Warm and dry NEUROLOGIC:  Alert and oriented x 3 PSYCHIATRIC:  Normal affect   ASSESSMENT:    1. Paroxysmal atrial fibrillation (HCC)   2. Leg edema   3. Essential hypertension   4. Morbid obesity (HCC)   5. Smoking    PLAN:    In order of problems listed above:  1. Patient with history of A. fib flutter.  2-week cardiac monitor showed paroxysmal atrial fibrillation, 2% burden.  Echocardiogram 04/2020 shows normal ejection fraction with EF 55 to 60%, impaired relaxation..  CHA2DS2-VASc score 2 (htn, gender).  Continue Cardizem CD 120 mg daily, Xarelto 20 mg daily.   2. Lower extremity edema noted on exam.  Patient not compliant with Lasix.  Did not take diuretic  today.  She was counseled on medication adherence. 3. Blood pressure well controlled.  Continue Cardizem. 4. Patient with morbid obesity, BMI 82.  Weight loss/low calorie diet advised.  Will refer patient to nutritionist/dietary. 5. History of smoking, cessation advised.  Follow-up in 3 months  Total encounter time 40 minutes  Greater than 50% was spent in counseling and coordination of care with the patient    This note was generated in part or whole with voice recognition software. Voice recognition is usually quite accurate but there are transcription errors that can and very often do occur. I apologize for any typographical errors that were not detected and corrected.  Medication Adjustments/Labs and Tests Ordered: Current medicines are reviewed at length with the patient today.  Concerns regarding medicines are outlined above.  Orders Placed This Encounter  Procedures  . Ambulatory referral to Nutrition and Diabetic Education   No orders of the defined types were placed in this encounter.   Patient Instructions  Medication Instructions:  Your physician recommends that you continue on your current medications as directed. Please refer to the Current Medication list given to you today. *If you need a refill on your cardiac medications before your next appointment, please call your pharmacy*   Lab Work: None Ordered If you have labs (blood work) drawn today and your tests are completely normal, you will receive your results only by: Marland Kitchen MyChart Message (if you have MyChart) OR . A paper copy in the mail If you have any lab test that is abnormal or we need to change your treatment, we will call you to review the results.   Testing/Procedures: None Ordered   Follow-Up: At [facility], you and your health needs are our priority.  As part of our continuing mission to provide you with exceptional heart care, we have created designated Provider Care Teams.  These Care Teams  include your primary Cardiologist (physician) and Advanced Practice Providers (APPs -  Physician Assistants and Nurse Practitioners) who all work together to provide you with the care you need, when you need it.  We recommend signing up for the patient portal called "MyChart".  Sign up information is provided on this After Visit Summary.  MyChart is used to connect with patients for Virtual Visits (Telemedicine).  Patients are able to view lab/test results, encounter notes, upcoming appointments, etc.  Non-urgent messages can be sent to your provider as well.   To learn more about what you can do with MyChart, go to [email or web address].    Your next appointment:   3 month(s)  The format for your next appointment:   In Person  Provider:      Other Instructions  Referral to Nutrition      Signed, Debbe Odea, MD  05/30/2020 1:09 PM    [facility]

## 2020-06-05 ENCOUNTER — Other Ambulatory Visit: Payer: Self-pay

## 2020-06-05 NOTE — Addendum Note (Signed)
Addended by: Gibson Ramp on: 06/05/2020 01:31 PM   Modules accepted: Orders

## 2020-06-19 ENCOUNTER — Ambulatory Visit: Payer: Medicare Other | Admitting: Dietician

## 2020-06-19 DIAGNOSIS — J449 Chronic obstructive pulmonary disease, unspecified: Secondary | ICD-10-CM | POA: Diagnosis not present

## 2020-06-19 DIAGNOSIS — I5030 Unspecified diastolic (congestive) heart failure: Secondary | ICD-10-CM | POA: Diagnosis not present

## 2020-06-19 DIAGNOSIS — I4891 Unspecified atrial fibrillation: Secondary | ICD-10-CM | POA: Diagnosis not present

## 2020-06-19 DIAGNOSIS — Z7901 Long term (current) use of anticoagulants: Secondary | ICD-10-CM | POA: Diagnosis not present

## 2020-06-19 DIAGNOSIS — I4892 Unspecified atrial flutter: Secondary | ICD-10-CM | POA: Diagnosis not present

## 2020-06-19 DIAGNOSIS — F17200 Nicotine dependence, unspecified, uncomplicated: Secondary | ICD-10-CM | POA: Diagnosis not present

## 2020-06-19 DIAGNOSIS — I110 Hypertensive heart disease with heart failure: Secondary | ICD-10-CM | POA: Diagnosis not present

## 2020-06-19 DIAGNOSIS — M13859 Other specified arthritis, unspecified hip: Secondary | ICD-10-CM | POA: Diagnosis not present

## 2020-06-19 DIAGNOSIS — M79651 Pain in right thigh: Secondary | ICD-10-CM | POA: Diagnosis not present

## 2020-06-19 DIAGNOSIS — Z8639 Personal history of other endocrine, nutritional and metabolic disease: Secondary | ICD-10-CM | POA: Diagnosis not present

## 2020-06-19 DIAGNOSIS — M109 Gout, unspecified: Secondary | ICD-10-CM | POA: Diagnosis not present

## 2020-06-19 DIAGNOSIS — Z7984 Long term (current) use of oral hypoglycemic drugs: Secondary | ICD-10-CM | POA: Diagnosis not present

## 2020-06-19 DIAGNOSIS — R7303 Prediabetes: Secondary | ICD-10-CM | POA: Diagnosis not present

## 2020-06-19 DIAGNOSIS — I739 Peripheral vascular disease, unspecified: Secondary | ICD-10-CM | POA: Diagnosis not present

## 2020-06-19 DIAGNOSIS — E039 Hypothyroidism, unspecified: Secondary | ICD-10-CM | POA: Diagnosis not present

## 2020-06-25 DIAGNOSIS — Z7984 Long term (current) use of oral hypoglycemic drugs: Secondary | ICD-10-CM | POA: Diagnosis not present

## 2020-06-25 DIAGNOSIS — R7303 Prediabetes: Secondary | ICD-10-CM | POA: Diagnosis not present

## 2020-06-25 DIAGNOSIS — I739 Peripheral vascular disease, unspecified: Secondary | ICD-10-CM | POA: Diagnosis not present

## 2020-06-25 DIAGNOSIS — Z7901 Long term (current) use of anticoagulants: Secondary | ICD-10-CM | POA: Diagnosis not present

## 2020-06-25 DIAGNOSIS — I4891 Unspecified atrial fibrillation: Secondary | ICD-10-CM | POA: Diagnosis not present

## 2020-06-25 DIAGNOSIS — E039 Hypothyroidism, unspecified: Secondary | ICD-10-CM | POA: Diagnosis not present

## 2020-06-25 DIAGNOSIS — F17200 Nicotine dependence, unspecified, uncomplicated: Secondary | ICD-10-CM | POA: Diagnosis not present

## 2020-06-25 DIAGNOSIS — M79651 Pain in right thigh: Secondary | ICD-10-CM | POA: Diagnosis not present

## 2020-06-25 DIAGNOSIS — Z8639 Personal history of other endocrine, nutritional and metabolic disease: Secondary | ICD-10-CM | POA: Diagnosis not present

## 2020-06-25 DIAGNOSIS — J449 Chronic obstructive pulmonary disease, unspecified: Secondary | ICD-10-CM | POA: Diagnosis not present

## 2020-06-25 DIAGNOSIS — M109 Gout, unspecified: Secondary | ICD-10-CM | POA: Diagnosis not present

## 2020-06-25 DIAGNOSIS — M13859 Other specified arthritis, unspecified hip: Secondary | ICD-10-CM | POA: Diagnosis not present

## 2020-06-25 DIAGNOSIS — I4892 Unspecified atrial flutter: Secondary | ICD-10-CM | POA: Diagnosis not present

## 2020-06-25 DIAGNOSIS — I110 Hypertensive heart disease with heart failure: Secondary | ICD-10-CM | POA: Diagnosis not present

## 2020-06-25 DIAGNOSIS — I5030 Unspecified diastolic (congestive) heart failure: Secondary | ICD-10-CM | POA: Diagnosis not present

## 2020-07-02 ENCOUNTER — Ambulatory Visit: Payer: Medicare Other | Admitting: Dietician

## 2020-07-06 DIAGNOSIS — J449 Chronic obstructive pulmonary disease, unspecified: Secondary | ICD-10-CM | POA: Diagnosis not present

## 2020-07-11 DIAGNOSIS — J449 Chronic obstructive pulmonary disease, unspecified: Secondary | ICD-10-CM | POA: Diagnosis not present

## 2020-07-16 DIAGNOSIS — I739 Peripheral vascular disease, unspecified: Secondary | ICD-10-CM | POA: Diagnosis not present

## 2020-07-16 DIAGNOSIS — M109 Gout, unspecified: Secondary | ICD-10-CM | POA: Diagnosis not present

## 2020-07-16 DIAGNOSIS — I4892 Unspecified atrial flutter: Secondary | ICD-10-CM | POA: Diagnosis not present

## 2020-07-16 DIAGNOSIS — M79651 Pain in right thigh: Secondary | ICD-10-CM | POA: Diagnosis not present

## 2020-07-16 DIAGNOSIS — E039 Hypothyroidism, unspecified: Secondary | ICD-10-CM | POA: Diagnosis not present

## 2020-07-16 DIAGNOSIS — F17200 Nicotine dependence, unspecified, uncomplicated: Secondary | ICD-10-CM | POA: Diagnosis not present

## 2020-07-16 DIAGNOSIS — Z7901 Long term (current) use of anticoagulants: Secondary | ICD-10-CM | POA: Diagnosis not present

## 2020-07-16 DIAGNOSIS — J449 Chronic obstructive pulmonary disease, unspecified: Secondary | ICD-10-CM | POA: Diagnosis not present

## 2020-07-16 DIAGNOSIS — M13859 Other specified arthritis, unspecified hip: Secondary | ICD-10-CM | POA: Diagnosis not present

## 2020-07-16 DIAGNOSIS — R7303 Prediabetes: Secondary | ICD-10-CM | POA: Diagnosis not present

## 2020-07-16 DIAGNOSIS — I4891 Unspecified atrial fibrillation: Secondary | ICD-10-CM | POA: Diagnosis not present

## 2020-07-16 DIAGNOSIS — I110 Hypertensive heart disease with heart failure: Secondary | ICD-10-CM | POA: Diagnosis not present

## 2020-07-16 DIAGNOSIS — I5030 Unspecified diastolic (congestive) heart failure: Secondary | ICD-10-CM | POA: Diagnosis not present

## 2020-07-16 DIAGNOSIS — Z8639 Personal history of other endocrine, nutritional and metabolic disease: Secondary | ICD-10-CM | POA: Diagnosis not present

## 2020-07-16 DIAGNOSIS — Z7984 Long term (current) use of oral hypoglycemic drugs: Secondary | ICD-10-CM | POA: Diagnosis not present

## 2020-07-18 DIAGNOSIS — R7303 Prediabetes: Secondary | ICD-10-CM | POA: Diagnosis not present

## 2020-07-18 DIAGNOSIS — I739 Peripheral vascular disease, unspecified: Secondary | ICD-10-CM | POA: Diagnosis not present

## 2020-07-18 DIAGNOSIS — J449 Chronic obstructive pulmonary disease, unspecified: Secondary | ICD-10-CM | POA: Diagnosis not present

## 2020-07-18 DIAGNOSIS — I5030 Unspecified diastolic (congestive) heart failure: Secondary | ICD-10-CM | POA: Diagnosis not present

## 2020-07-18 DIAGNOSIS — F17200 Nicotine dependence, unspecified, uncomplicated: Secondary | ICD-10-CM | POA: Diagnosis not present

## 2020-07-18 DIAGNOSIS — Z8639 Personal history of other endocrine, nutritional and metabolic disease: Secondary | ICD-10-CM | POA: Diagnosis not present

## 2020-07-18 DIAGNOSIS — M79651 Pain in right thigh: Secondary | ICD-10-CM | POA: Diagnosis not present

## 2020-07-18 DIAGNOSIS — Z7901 Long term (current) use of anticoagulants: Secondary | ICD-10-CM | POA: Diagnosis not present

## 2020-07-18 DIAGNOSIS — I110 Hypertensive heart disease with heart failure: Secondary | ICD-10-CM | POA: Diagnosis not present

## 2020-07-18 DIAGNOSIS — M109 Gout, unspecified: Secondary | ICD-10-CM | POA: Diagnosis not present

## 2020-07-18 DIAGNOSIS — Z7984 Long term (current) use of oral hypoglycemic drugs: Secondary | ICD-10-CM | POA: Diagnosis not present

## 2020-07-18 DIAGNOSIS — I4891 Unspecified atrial fibrillation: Secondary | ICD-10-CM | POA: Diagnosis not present

## 2020-07-18 DIAGNOSIS — M13859 Other specified arthritis, unspecified hip: Secondary | ICD-10-CM | POA: Diagnosis not present

## 2020-07-18 DIAGNOSIS — I4892 Unspecified atrial flutter: Secondary | ICD-10-CM | POA: Diagnosis not present

## 2020-07-18 DIAGNOSIS — E039 Hypothyroidism, unspecified: Secondary | ICD-10-CM | POA: Diagnosis not present

## 2020-07-22 ENCOUNTER — Other Ambulatory Visit: Payer: Self-pay

## 2020-07-25 ENCOUNTER — Encounter: Payer: Self-pay | Admitting: Gastroenterology

## 2020-07-25 ENCOUNTER — Ambulatory Visit: Payer: Medicare Other | Admitting: Gastroenterology

## 2020-08-05 DIAGNOSIS — J449 Chronic obstructive pulmonary disease, unspecified: Secondary | ICD-10-CM | POA: Diagnosis not present

## 2020-08-10 DIAGNOSIS — J449 Chronic obstructive pulmonary disease, unspecified: Secondary | ICD-10-CM | POA: Diagnosis not present

## 2020-08-30 ENCOUNTER — Ambulatory Visit: Payer: Medicare Other | Admitting: Cardiology

## 2020-09-02 ENCOUNTER — Encounter: Payer: Self-pay | Admitting: Cardiology

## 2020-09-05 DIAGNOSIS — J449 Chronic obstructive pulmonary disease, unspecified: Secondary | ICD-10-CM | POA: Diagnosis not present

## 2020-09-10 DIAGNOSIS — J449 Chronic obstructive pulmonary disease, unspecified: Secondary | ICD-10-CM | POA: Diagnosis not present

## 2020-10-05 DIAGNOSIS — J449 Chronic obstructive pulmonary disease, unspecified: Secondary | ICD-10-CM | POA: Diagnosis not present

## 2020-10-10 DIAGNOSIS — J449 Chronic obstructive pulmonary disease, unspecified: Secondary | ICD-10-CM | POA: Diagnosis not present

## 2021-04-18 ENCOUNTER — Encounter: Payer: Self-pay | Admitting: Emergency Medicine

## 2021-04-18 ENCOUNTER — Emergency Department
Admission: EM | Admit: 2021-04-18 | Discharge: 2021-04-19 | Disposition: A | Discharge status: Other Acute Inpatient Hospital | Payer: Medicare HMO | Attending: Emergency Medicine | Admitting: Emergency Medicine

## 2021-04-18 ENCOUNTER — Emergency Department: Payer: Medicare HMO

## 2021-04-18 ENCOUNTER — Other Ambulatory Visit: Payer: Self-pay

## 2021-04-18 DIAGNOSIS — K8309 Other cholangitis: Secondary | ICD-10-CM | POA: Diagnosis not present

## 2021-04-18 DIAGNOSIS — Z20822 Contact with and (suspected) exposure to covid-19: Secondary | ICD-10-CM | POA: Insufficient documentation

## 2021-04-18 DIAGNOSIS — F17210 Nicotine dependence, cigarettes, uncomplicated: Secondary | ICD-10-CM | POA: Insufficient documentation

## 2021-04-18 DIAGNOSIS — R109 Unspecified abdominal pain: Secondary | ICD-10-CM | POA: Diagnosis present

## 2021-04-18 DIAGNOSIS — K8050 Calculus of bile duct without cholangitis or cholecystitis without obstruction: Secondary | ICD-10-CM | POA: Diagnosis not present

## 2021-04-18 DIAGNOSIS — E119 Type 2 diabetes mellitus without complications: Secondary | ICD-10-CM | POA: Diagnosis not present

## 2021-04-18 DIAGNOSIS — R7989 Other specified abnormal findings of blood chemistry: Secondary | ICD-10-CM | POA: Diagnosis present

## 2021-04-18 DIAGNOSIS — J449 Chronic obstructive pulmonary disease, unspecified: Secondary | ICD-10-CM | POA: Diagnosis not present

## 2021-04-18 DIAGNOSIS — Z79899 Other long term (current) drug therapy: Secondary | ICD-10-CM | POA: Diagnosis not present

## 2021-04-18 DIAGNOSIS — R748 Abnormal levels of other serum enzymes: Secondary | ICD-10-CM

## 2021-04-18 DIAGNOSIS — R079 Chest pain, unspecified: Secondary | ICD-10-CM | POA: Diagnosis not present

## 2021-04-18 DIAGNOSIS — R112 Nausea with vomiting, unspecified: Secondary | ICD-10-CM | POA: Insufficient documentation

## 2021-04-18 DIAGNOSIS — I5032 Chronic diastolic (congestive) heart failure: Secondary | ICD-10-CM | POA: Diagnosis present

## 2021-04-18 DIAGNOSIS — Z720 Tobacco use: Secondary | ICD-10-CM | POA: Diagnosis present

## 2021-04-18 DIAGNOSIS — R1900 Intra-abdominal and pelvic swelling, mass and lump, unspecified site: Secondary | ICD-10-CM | POA: Diagnosis present

## 2021-04-18 DIAGNOSIS — R945 Abnormal results of liver function studies: Secondary | ICD-10-CM | POA: Diagnosis present

## 2021-04-18 DIAGNOSIS — I5033 Acute on chronic diastolic (congestive) heart failure: Secondary | ICD-10-CM | POA: Diagnosis not present

## 2021-04-18 DIAGNOSIS — Z7984 Long term (current) use of oral hypoglycemic drugs: Secondary | ICD-10-CM | POA: Diagnosis not present

## 2021-04-18 DIAGNOSIS — R7401 Elevation of levels of liver transaminase levels: Secondary | ICD-10-CM | POA: Insufficient documentation

## 2021-04-18 DIAGNOSIS — R1011 Right upper quadrant pain: Secondary | ICD-10-CM | POA: Diagnosis not present

## 2021-04-18 DIAGNOSIS — F32A Depression, unspecified: Secondary | ICD-10-CM | POA: Diagnosis present

## 2021-04-18 DIAGNOSIS — E039 Hypothyroidism, unspecified: Secondary | ICD-10-CM | POA: Diagnosis not present

## 2021-04-18 DIAGNOSIS — F17200 Nicotine dependence, unspecified, uncomplicated: Secondary | ICD-10-CM | POA: Diagnosis present

## 2021-04-18 DIAGNOSIS — I480 Paroxysmal atrial fibrillation: Secondary | ICD-10-CM | POA: Diagnosis present

## 2021-04-18 DIAGNOSIS — Z7901 Long term (current) use of anticoagulants: Secondary | ICD-10-CM | POA: Insufficient documentation

## 2021-04-18 DIAGNOSIS — R1010 Upper abdominal pain, unspecified: Secondary | ICD-10-CM | POA: Diagnosis present

## 2021-04-18 LAB — COMPREHENSIVE METABOLIC PANEL
ALT: 529 U/L — ABNORMAL HIGH (ref 0–44)
ALT: 683 U/L — ABNORMAL HIGH (ref 0–44)
AST: 834 U/L — ABNORMAL HIGH (ref 15–41)
AST: 733 U/L — ABNORMAL HIGH (ref 15–41)
Albumin: 3.7 g/dL (ref 3.5–5.0)
Albumin: 4.1 g/dL (ref 3.5–5.0)
Alkaline Phosphatase: 166 U/L — ABNORMAL HIGH (ref 38–126)
Alkaline Phosphatase: 230 U/L — ABNORMAL HIGH (ref 38–126)
Anion gap: 5 (ref 5–15)
Anion gap: 10 (ref 5–15)
BUN: 14 mg/dL (ref 6–20)
BUN: 14 mg/dL (ref 6–20)
CO2: 27 mmol/L (ref 22–32)
CO2: 23 mmol/L (ref 22–32)
Calcium: 9.3 mg/dL (ref 8.9–10.3)
Calcium: 9.2 mg/dL (ref 8.9–10.3)
Chloride: 107 mmol/L (ref 98–111)
Chloride: 105 mmol/L (ref 98–111)
Creatinine, Ser: 0.84 mg/dL (ref 0.44–1.00)
Creatinine, Ser: 0.85 mg/dL (ref 0.44–1.00)
GFR, Estimated: >60 mL/min (ref >60)
GFR, Estimated: >60 mL/min (ref >60)
Glucose, Bld: 178 mg/dL — ABNORMAL HIGH (ref 70–99)
Glucose, Bld: 148 mg/dL — ABNORMAL HIGH (ref 70–99)
Potassium: 4.1 mmol/L (ref 3.5–5.1)
Potassium: 4.5 mmol/L (ref 3.5–5.1)
Sodium: 139 mmol/L (ref 135–145)
Sodium: 138 mmol/L (ref 135–145)
Total Bilirubin: 2.2 mg/dL — ABNORMAL HIGH (ref 0.3–1.2)
Total Bilirubin: 4.6 mg/dL — ABNORMAL HIGH (ref 0.3–1.2)
Total Protein: 7 g/dL (ref 6.5–8.1)
Total Protein: 7.9 g/dL (ref 6.5–8.1)

## 2021-04-18 LAB — CBC WITH DIFFERENTIAL/PLATELET
Abs Immature Granulocytes: 0.09 10*3/uL — ABNORMAL HIGH (ref 0.00–0.07)
Basophils Absolute: 0.1 10*3/uL (ref 0.0–0.1)
Basophils Relative: 0 %
Eosinophils Absolute: 0.1 10*3/uL (ref 0.0–0.5)
Eosinophils Relative: 0 %
HCT: 43.2 % (ref 36.0–46.0)
Hemoglobin: 13.4 g/dL (ref 12.0–15.0)
Immature Granulocytes: 1 %
Lymphocytes Relative: 7 %
Lymphs Abs: 1.3 10*3/uL (ref 0.7–4.0)
MCH: 23.6 pg — ABNORMAL LOW (ref 26.0–34.0)
MCHC: 31 g/dL (ref 30.0–36.0)
MCV: 75.9 fL — ABNORMAL LOW (ref 80.0–100.0)
Monocytes Absolute: 0.8 10*3/uL (ref 0.1–1.0)
Monocytes Relative: 5 %
Neutro Abs: 15.2 10*3/uL — ABNORMAL HIGH (ref 1.7–7.7)
Neutrophils Relative %: 87 %
Platelets: 278 10*3/uL (ref 150–400)
RBC: 5.69 MIL/uL — ABNORMAL HIGH (ref 3.87–5.11)
RDW: 14.5 % (ref 11.5–15.5)
WBC: 17.5 10*3/uL — ABNORMAL HIGH (ref 4.0–10.5)
nRBC: 0 % (ref 0.0–0.2)

## 2021-04-18 LAB — CBC
HCT: 41.1 % (ref 36.0–46.0)
Hemoglobin: 12.9 g/dL (ref 12.0–15.0)
MCH: 23.7 pg — ABNORMAL LOW (ref 26.0–34.0)
MCHC: 31.4 g/dL (ref 30.0–36.0)
MCV: 75.6 fL — ABNORMAL LOW (ref 80.0–100.0)
Platelets: 235 10*3/uL (ref 150–400)
RBC: 5.44 MIL/uL — ABNORMAL HIGH (ref 3.87–5.11)
RDW: 14.5 % (ref 11.5–15.5)
WBC: 11.8 10*3/uL — ABNORMAL HIGH (ref 4.0–10.5)
nRBC: 0 % (ref 0.0–0.2)

## 2021-04-18 LAB — LIPASE, BLOOD
Lipase: 26 U/L (ref 11–51)
Lipase: 40 U/L (ref 11–51)

## 2021-04-18 LAB — RESP PANEL BY RT-PCR (FLU A&B, COVID) ARPGX2
Influenza A by PCR: NEGATIVE
Influenza B by PCR: NEGATIVE
SARS Coronavirus 2 by RT PCR: NEGATIVE

## 2021-04-18 MED ORDER — SODIUM CHLORIDE 0.9 % IV BOLUS
500.0000 mL | Freq: Once | INTRAVENOUS | Status: AC
  Administered 2021-04-18: 500 mL via INTRAVENOUS

## 2021-04-18 MED ORDER — MORPHINE SULFATE (PF) 4 MG/ML IV SOLN
4.0000 mg | Freq: Once | INTRAVENOUS | Status: AC
  Administered 2021-04-18: 4 mg via INTRAVENOUS
  Filled 2021-04-18: qty 1

## 2021-04-18 MED ORDER — IOHEXOL 350 MG/ML SOLN: 150.0000 mL | Freq: Once | INTRAVENOUS | Status: DC | PRN

## 2021-04-18 MED ORDER — PIPERACILLIN-TAZOBACTAM 3.375 G IVPB 30 MIN
3.3750 g | Freq: Once | INTRAVENOUS | Status: AC
  Administered 2021-04-18: 3.375 g via INTRAVENOUS
  Filled 2021-04-18: qty 50

## 2021-04-18 MED ORDER — ONDANSETRON HCL 4 MG/2ML IJ SOLN
4.0000 mg | Freq: Once | INTRAMUSCULAR | Status: AC
  Administered 2021-04-18: 4 mg via INTRAVENOUS
  Filled 2021-04-18: qty 2

## 2021-04-18 MED ORDER — MORPHINE SULFATE (PF) 4 MG/ML IV SOLN
4.0000 mg | Freq: Once | INTRAVENOUS | Status: AC
Start: 2021-04-18 — End: 2021-04-18
  Administered 2021-04-18: 4 mg via INTRAVENOUS
  Filled 2021-04-18: qty 1

## 2021-04-18 NOTE — ED Notes (Signed)
[facility]  TRANSFER  CALLED PER  DR  Vicente Males  MD

## 2021-04-18 NOTE — ED Provider Notes (Signed)
[facility] Emergency Department Provider Note   ____________________________________________   Event Date/Time   First MD Initiated Contact with Patient 04/18/21 [phone]     (approximate)  I have reviewed the triage vital signs and the nursing notes.   HISTORY  Chief Complaint Abdominal Pain    HPI Alexandra Black is a 60 y.o. female history of obesity, paroxysmal A. fib on Xarelto  Patient reports that off-and-on over the last couple months she will have episodes of a fairly severe upper abdominal pain usually more on the right side.  It comes and goes.  Yesterday the pain seemed to start in the middle of her abdomen and lower chest and then it seems to have seeded itself in her right mid abdomen.  It is associated with nausea vomiting had 1 or 2 loose stools.  Pain is unremitting for about 24 hours now  Denies chest pain now but reports she felt like her lower chest pain when it first started  Same symptoms of occurred multiple times over the last couple of months but never lasted this long.  Has not taken anything to relieve pain Past Medical History:  Diagnosis Date   (HFpEF) heart failure with preserved ejection fraction (HCC)    a. 06/2019 Echo ([facility]): EF 55-60%; b. 04/2020 Echo: EF 55-60%, no rwma, Gr1 DD. Nl RV size/fxn. Mildly elev PASP. Triv MR.   Depression    Hypothyroidism    Iron deficiency    Morbid obesity (HCC)    a. s/p bariatric surgery in 2002 (pt unsure of type of surgery).   OSA (obstructive sleep apnea)    Osteoarthritis    PAF (paroxysmal atrial fibrillation) (HCC)    a. 12/2019 [facility]: Avg HR 84 (32-214). Sinus rhythm w/ brief runs of Afib (2% burden - avg 112 bpm, 47-187). Pauses up to 3.5 secs.   Paroxysmal Atrial flutter (HCC)    Vitamin B12 deficiency     Patient Active Problem List   Diagnosis Date Noted   Acute on chronic diastolic heart failure (HCC)    Acute respiratory failure with hypoxia (HCC) 05/11/2020   OSA  (obstructive sleep apnea)    Atrial flutter (HCC)    Depression    Hypothyroidism    COPD (chronic obstructive pulmonary disease) (HCC)    Morbid obesity with BMI of 70 and over, adult (HCC)    GERD (gastroesophageal reflux disease)    Type 2 diabetes mellitus without complication [facility])     Past Surgical History:  Procedure Laterality Date   BARIATRIC SURGERY  2002    Prior to Admission medications   Medication Sig Start Date End Date Taking? Authorizing Provider  b complex vitamins tablet Take 1 tablet by mouth daily.    [provider]  Biotin 1000 MCG CHEW Chew by mouth.    [provider]  Cholecalciferol 25 MCG (1000 UT) capsule Take 1,000 Units by mouth daily.    [provider]  Cyanocobalamin (B-12) 5000 MCG CAPS Take by mouth daily.    [provider]  diltiazem (CARDIZEM) 120 MG tablet Take 120 mg by mouth daily.    [provider]  ELDERBERRY PO Take by mouth daily.    [provider]  furosemide (LASIX) 40 MG tablet Take 1 tablet (40 mg total) by mouth daily. 05/17/20 05/17/21  Alwyn Ren, MD  guaiFENesin (MUCINEX) 600 MG 12 hr tablet Take 600-1,200 mg by mouth 2 (two) times daily as needed.    [provider]  ipratropium-albuterol (DUONEB) 0.5-2.5 (3) MG/3ML SOLN Take 3 mLs by nebulization.    [provider]  levothyroxine (SYNTHROID) 150 MCG tablet Take 150 mcg by mouth daily before breakfast.    [provider]  metFORMIN (GLUCOPHAGE) 500 MG tablet Take 500 mg by mouth 2 (two) times daily with a meal.    [provider]  Multiple Vitamin (MULTI-VITAMIN) tablet Take 1 tablet by mouth daily.    [provider]  Multiple Vitamin (MULTIVITAMIN ADULT PO) Take by mouth daily.    [provider]  polyethylene glycol (MIRALAX / GLYCOLAX) 17 g packet Take 17 g by mouth daily as needed for mild constipation. 05/17/20   Alwyn Ren, MD  potassium chloride  (MICRO-K) 10 MEQ CR capsule Take 10 mEq by mouth daily.    [provider]  predniSONE (DELTASONE) 10 MG tablet Take 4 tablets daily for 3 days  Then 3 tablets daily for 3 days  Then 2 tablets daily for 3 days then 1 tablet daily  Till done 05/17/20   Alwyn Ren, MD  rivaroxaban (XARELTO) 20 MG TABS tablet Take 20 mg by mouth daily with supper.    [provider]  senna-docusate (SENOKOT-S) 8.6-50 MG tablet Take 1 tablet by mouth 2 (two) times daily. 05/17/20   Alwyn Ren, MD  venlafaxine XR (EFFEXOR-XR) 150 MG 24 hr capsule Take 150 mg by mouth daily with breakfast.    [provider]  vitamin E 180 MG (400 UNITS) capsule Take by mouth.    [provider]    Allergies Patient has no known allergies.  Family History  Problem Relation Age of Onset   Heart disease Mother    Hypertension Mother    Peripheral Artery Disease Father    Obesity Sister    Obesity Brother    Obesity Sister    Depression Sister    Obesity Sister     Social History Social History   Tobacco Use   Smoking status: Every Day    Packs/day: 1.00    Pack years: 0.00    Types: Cigarettes   Smokeless tobacco: Never   Tobacco comments:    Currently smoking 1 ppd.  Quit for 20 yrs between ages of ~ [age].  Substance Use Topics   Alcohol use: Yes    Alcohol/week: 2.0 standard drinks    Types: 2 Glasses of wine per week   Drug use: Yes    Types: Marijuana    Comment: 2 puffs on a joint daily.    Review of Systems Constitutional: No fever/chills ENT: No sore throat. Cardiovascular: See HPI Respiratory: Denies shortness of breath. Gastrointestinal: See HPI Genitourinary: Negative for dysuria. Musculoskeletal: Negative for back pain. Skin: Negative for rash. Neurological: Negative for headaches, areas of focal weakness or numbness.    ____________________________________________   PHYSICAL EXAM:  VITAL SIGNS: ED Triage Vitals  Enc Vitals  Group     BP 04/18/21 0901 (!) 116/56     Pulse Rate 04/18/21 0901 65     Resp 04/18/21 0901 20     Temp 04/18/21 0901 98.8 F (37.1 C)     Temp Source 04/18/21 0901 Oral     SpO2 04/18/21 0901 95 %     Weight 04/18/21 0857 (!) 450 lb (204.1 kg)     Height 04/18/21 0857 5' 2.5" (1.588 m)     Head Circumference --      Peak Flow --  Pain Score 04/18/21 0857 10     Pain Loc --      Pain Edu? --      Excl. in GC? --     Constitutional: Alert and oriented. Well appearing and in no acute distress. Eyes: Conjunctivae are normal. Head: Atraumatic. Nose: No congestion/rhinnorhea. Mouth/Throat: Mucous membranes are moist. Neck: No stridor.  Cardiovascular: Normal rate, regular rhythm. Grossly normal heart sounds.  Good peripheral circulation. Respiratory: Normal respiratory effort.  No retractions. Lungs CTAB. Gastrointestinal: Morbidly obese.  Soft.  Not distended.  Has focal tenderness primarily in the epigastrium and right mid to right upper abdomen, but given her body habitus I am not certain I am able to truly localize the location of pain. Musculoskeletal: No lower extremity tenderness nor edema. Neurologic:  Normal speech and language. No gross focal neurologic deficits are appreciated.  Skin:  Skin is warm, dry and intact. No rash noted. Psychiatric: Mood and affect are normal. Speech and behavior are normal.  ____________________________________________   LABS (all labs ordered are listed, but only abnormal results are displayed)  Labs Reviewed  COMPREHENSIVE METABOLIC PANEL - Abnormal; Notable for the following components:      Result Value   Glucose, Bld 178 (*)    AST 834 (*)    ALT 529 (*)    Alkaline Phosphatase 166 (*)    Total Bilirubin 2.2 (*)    All other components within normal limits  CBC - Abnormal; Notable for the following components:   WBC 11.8 (*)    RBC 5.44 (*)    MCV 75.6 (*)    MCH 23.7 (*)    All other components within normal limits  RESP  PANEL BY RT-PCR (FLU A&B, COVID) ARPGX2  LIPASE, BLOOD  URINALYSIS, COMPLETE (UACMP) WITH MICROSCOPIC   ____________________________________________  EKG  Reviewed inter by me at 930 Heart rate 65 9 QRS 109 QTc 430 Normal sinus rhythm, somewhat low voltages, questionably due to body habitus, minimal nonspecific T wave abnormality.  No obvious ischemia ____________________________________________  RADIOLOGY  CT ABDOMEN PELVIS WO CONTRAST  Result Date: 04/18/2021 CLINICAL DATA:  Right-sided abdominal pain. Some nausea and vomiting. EXAM: CT ABDOMEN AND PELVIS WITHOUT CONTRAST TECHNIQUE: Multidetector CT imaging of the abdomen and pelvis was performed following the standard protocol without IV contrast. COMPARISON:  Right upper quadrant ultrasound from same day. FINDINGS: Lower chest: No acute abnormality. Hepatobiliary: Diffusely decreased hepatic density. No focal abnormality. Multiple tiny gallstones. No gallbladder wall thickening or biliary dilatation. Pancreas: Unremarkable. No pancreatic ductal dilatation or surrounding inflammatory changes. Spleen: Normal in size without focal abnormality. Adrenals/Urinary Tract: Adrenal glands are unremarkable. Kidneys are normal, without renal calculi, focal lesion, or hydronephrosis. Bladder is unremarkable. Stomach/Bowel: Postsurgical changes of the proximal stomach. No bowel wall thickening, distention, or surrounding inflammatory changes. Normal appendix. Vascular/Lymphatic: No significant vascular findings are present. No enlarged abdominal or pelvic lymph nodes. Reproductive: Small calcified uterine fibroids.  No adnexal mass. Other: Round 3.5 x 2.9 x 3.4 cm fat density lesion in the posterior pelvis (series 2, image 67). No free fluid or pneumoperitoneum. No abdominal wall hernia. Musculoskeletal: No acute or significant osseous findings. Severe lower lumbar facet arthropathy and bilateral hip osteoarthritis. IMPRESSION: 1. No acute intra-abdominal  process. 2. Hepatic steatosis. 3. Cholelithiasis. 4. Nonspecific 3.5 cm fatty mass in the posterior pelvis without aggressive features. Follow-up CT in 6-12 months suggested to evaluate for interval change. Electronically Signed   By: Obie Dredge M.D.   On: 04/18/2021 12:35  US ABDOMEN LIMITED RUQ (LIVER/GB)  Result Date: 04/18/2021 CLINICAL DATA:  Onset right upper quadrant pain yesterday. EXAM: ULTRASOUND ABDOMEN LIMITED RIGHT UPPER QUADRANT COMPARISON:  None. FINDINGS: Gallbladder: A few tiny gravel type stones are seen layering dependently within the gallbladder. No gallbladder wall thickening or pericholecystic fluid. Sonographer reports negative Murphy's sign. Common bile duct: Diameter: 0.4 cm. Liver: No focal lesion. Echogenicity is increased. Portal vein is patent on color Doppler imaging with normal direction of blood flow towards the liver. Other: None. IMPRESSION: The patient has tiny, gravel type gallstones without evidence of cholecystitis. Fatty infiltration of the liver. Electronically Signed   By: Drusilla Kanner M.D.   On: 04/18/2021 10:53      ____________________________________________   PROCEDURES  Procedure(s) performed: None  Procedures  Critical Care performed: No  ____________________________________________   INITIAL IMPRESSION / ASSESSMENT AND PLAN / ED COURSE  Pertinent labs & imaging results that were available during my care of the patient were reviewed by me and considered in my medical decision making (see chart for details).   Differential diagnosis includes but is not limited to, abdominal perforation, aortic dissection, cholecystitis, appendicitis, diverticulitis, colitis, esophagitis/gastritis, kidney stone, pyelonephritis, urinary tract infection, aortic aneurysm. All are considered in decision and treatment plan. Based upon the patient's presentation and risk factors, the location of pain with symptoms suspect likely right-sided or right upper  quadrant GI process.  Given the recurrent nature I am highly suspected of potential cholelithiasis or some sort of biliary etiology     Clinical Course as of 04/18/21 1534  Fri Apr 18, 2021  1242 Discussed with MRI tech, they have to come and evaluate her.  GI Dr. Tobi Bastos recommending MRCP, but need to be evaluated to see if the patient will be able to fit into the MRI due to body habitus [MQ]  1243 Discussed with Dr. Tobi Bastos of GI, he recommends obtaining MRCP.  I also paged general surgery to discuss and have them review presentation. [MQ]  1246 Dr. Lady Gary reviewed labs and imaging. Dr. Lady Gary advises patient can not be managed at [facility] if surgery is needed due to technicalities of obesity and size. However, also advises this does not clearly appear to be acute surgical. Next step in work-up would appear to be MRCP at this point per Dr. Lady Gary and Dr. Tobi Bastos [MQ]  1249 MR tech measured and advises patient does NOT fit in any of the MRI machines that he is aware of in [facility] system.  [MQ]  1304 Dr. Tobi Bastos from GI going to consult on patient.  I also discussed with the patient, she is understanding of need for likely transfer to a facility that can manage and further evaluate for etiology of her abdominal pain and abnormal LFTs.  She does use acetaminophen from time to time, but reports she never takes more than 2 acetaminophen tablets in a 24-hour period.  No known history of hepatitis [MQ]  1348 Dr. Tobi Bastos has seen and evaluated the patient, he advises patient would require transfer to a center with capability of further work-up for possible biliary obstruction or cholecystitis.  I discussed with the patient, the patient would request to go to [facility].  Patient reports that she has previously received medical care there, will initiate transfer request to [name] at this time. [MQ]    Clinical Course User Index [MQ] Sharyn Creamer, MD   ----------------------------------------- 3:32 PM on  04/18/2021 ----------------------------------------- Spoke to Senath with the [name] transfer center at approximately 2 PM, exact  time not clear.  Ongoing care is assigned to Dr. Vicente MalesBradler, [facility]  The [name] facility has not yet contacted me with a physician to give report to or consideration for acceptance.  The patient is still pending transfer, and she will need to be transferred to a facility capable of caring for her morbid obesity and with advanced diagnostic tools associated with further evaluating for her acute hepatobiliary disease.  ____________________________________________   FINAL CLINICAL IMPRESSION(S) / ED DIAGNOSES  Final diagnoses:  Abdominal pain  Abnormal transaminases  RUQ pain        Note:  This document was prepared using Dragon voice recognition software and may include unintentional dictation errors       Sharyn CreamerQuale, Yamira Papa, MD 04/18/21 1534

## 2021-04-18 NOTE — ED Notes (Signed)
[facility]  CALLED  PER  DR  BRADLER  MD 

## 2021-04-18 NOTE — Progress Notes (Signed)
Patient came with abdominal pain, elevated LFTs suspicious for choledocholithiasis.  Patient overweight for elements MRI, and the GI [facility] does not perform endoscopy.  I asked ED physician to call discussed with [facility] at [facility], if GI agrees with ERCP directly without MRCP, then [facility] will accept transfer.

## 2021-04-18 NOTE — ED Notes (Signed)
XRAY  POWERSHARE  WITH  [name] 

## 2021-04-18 NOTE — ED Notes (Signed)
Pt accepted to waitlist at [facility].  I have ordered Bariatric bed for pt thru [facility]

## 2021-04-18 NOTE — Progress Notes (Signed)
Ultrasound to both arms - attempted & unable to provide IV access. RN made aware.

## 2021-04-18 NOTE — ED Notes (Signed)
MD at the bedside  

## 2021-04-18 NOTE — ED Triage Notes (Signed)
Pt comes into the ED via [facility] from home c/o generalized abdominal pain.  Pt complains of constipation and some nausea and vomiting.  Pt had stable vitals with EMS and appears in NAD with even and unlabored respirations.

## 2021-04-18 NOTE — ED Notes (Signed)
XRAY  POWERSHARE  WITH  [facility] 

## 2021-04-18 NOTE — ED Notes (Signed)
[name]  CALLED  PER  DR  Fanny Bien  MD

## 2021-04-18 NOTE — ED Notes (Signed)
Pt to CT

## 2021-04-18 NOTE — ED Notes (Signed)
IV team unsuccessful after several attempts. 2 other RN's attempted to place IV without success.

## 2021-04-18 NOTE — Consult Note (Signed)
Wyline Mood , MD [address], [address], [place], [place], [address] [address], [place], [place], [address] Phone: [phone]  Fax: [phone]  Consultation  Referring Provider:     Dr Fanny Bien  Primary Care Physician:  Pcp, No Primary Gastroenterologist:: None        Reason for Consultation:     Abnormal LFT's   Date of Admission:  04/18/2021 Date of Consultation:  04/18/2021         HPI:   Alexandra Black is a 60 y.o. female presented to the emergency room earlier today with abdominal pain nausea and vomiting.  The patient underwent a right upper quadrant ultrasound in the ER that showed gallstones without any evidence of cholecystitis and fatty infiltration of the liver.  This was followed up by a CT scan of the abdomen that showed hepatic steatosis, cholelithiasis no dilation of the common bile duct.  Nonspecific 3.5 cm fatty mass in the posterior pelvis without aggressive features recommendation was to repeat scan in 6 to 12 months.  I was called in because of abnormal LFTs with an AST of 834, ALT 529, alkaline phosphatase of 166 and total bilirubin of 2.2.  I do not have ordered LFTs to compare with.  Lipase was not elevated at 26.  Hemoglobin 12.9 g with an MCV of 25.6 hemoglobin 11 months back was also similar.   She states that all of a sudden after she had a piece of cake earlier today after breakfast developed severe generalized abdominal pain radiating to the RUQ , associated with nausea. Better after coming into the ER. Denies any NSAID use. No new medications or excess tylenol.   Past Medical History:  Diagnosis Date   (HFpEF) heart failure with preserved ejection fraction (HCC)    a. 06/2019 Echo ([facility]): EF 55-60%; b. 04/2020 Echo: EF 55-60%, no rwma, Gr1 DD. Nl RV size/fxn. Mildly elev PASP. Triv MR.   Depression    Hypothyroidism    Iron deficiency    Morbid obesity (HCC)    a. s/p bariatric surgery in 2002 (pt unsure of type of surgery).   OSA  (obstructive sleep apnea)    Osteoarthritis    PAF (paroxysmal atrial fibrillation) (HCC)    a. 12/2019 [facility]: Avg HR 84 (32-214). Sinus rhythm w/ brief runs of Afib (2% burden - avg 112 bpm, 47-187). Pauses up to 3.5 secs.   Paroxysmal Atrial flutter (HCC)    Vitamin B12 deficiency     Past Surgical History:  Procedure Laterality Date   BARIATRIC SURGERY  2002    Prior to Admission medications   Medication Sig Start Date End Date Taking? Authorizing Provider  b complex vitamins tablet Take 1 tablet by mouth daily.    [provider]  Biotin 1000 MCG CHEW Chew by mouth.    [provider]  Cholecalciferol 25 MCG (1000 UT) capsule Take 1,000 Units by mouth daily.    [provider]  Cyanocobalamin (B-12) 5000 MCG CAPS Take by mouth daily.    [provider]  diltiazem (CARDIZEM) 120 MG tablet Take 120 mg by mouth daily.    [provider]  ELDERBERRY PO Take by mouth daily.    [provider]  furosemide (LASIX) 40 MG tablet Take 1 tablet (40 mg total) by mouth daily. 05/17/20 05/17/21  Alwyn Ren, MD  guaiFENesin (MUCINEX) 600 MG 12 hr tablet Take 600-1,200 mg by mouth 2 (two) times daily as needed.  [provider]  ipratropium-albuterol (DUONEB) 0.5-2.5 (3) MG/3ML SOLN Take 3 mLs by nebulization.    [provider]  levothyroxine (SYNTHROID) 150 MCG tablet Take 150 mcg by mouth daily before breakfast.    [provider]  metFORMIN (GLUCOPHAGE) 500 MG tablet Take 500 mg by mouth 2 (two) times daily with a meal.    [provider]  Multiple Vitamin (MULTI-VITAMIN) tablet Take 1 tablet by mouth daily.    [provider]  Multiple Vitamin (MULTIVITAMIN ADULT PO) Take by mouth daily.    [provider]  polyethylene glycol (MIRALAX / GLYCOLAX) 17 g packet Take 17 g by mouth daily as needed for mild constipation. 05/17/20   Alwyn RenMathews, Elizabeth G, MD  potassium chloride (MICRO-K)  10 MEQ CR capsule Take 10 mEq by mouth daily.    [provider]  predniSONE (DELTASONE) 10 MG tablet Take 4 tablets daily for 3 days  Then 3 tablets daily for 3 days  Then 2 tablets daily for 3 days then 1 tablet daily  Till done 05/17/20   Alwyn RenMathews, Elizabeth G, MD  rivaroxaban (XARELTO) 20 MG TABS tablet Take 20 mg by mouth daily with supper.    [provider]  senna-docusate (SENOKOT-S) 8.6-50 MG tablet Take 1 tablet by mouth 2 (two) times daily. 05/17/20   Alwyn RenMathews, Elizabeth G, MD  venlafaxine XR (EFFEXOR-XR) 150 MG 24 hr capsule Take 150 mg by mouth daily with breakfast.    [provider]  vitamin E 180 MG (400 UNITS) capsule Take by mouth.    [provider]    Family History  Problem Relation Age of Onset   Heart disease Mother    Hypertension Mother    Peripheral Artery Disease Father    Obesity Sister    Obesity Brother    Obesity Sister    Depression Sister    Obesity Sister      Social History   Tobacco Use   Smoking status: Every Day    Packs/day: 1.00    Pack years: 0.00    Types: Cigarettes   Smokeless tobacco: Never   Tobacco comments:    Currently smoking 1 ppd.  Quit for 20 yrs between ages of ~ [age].  Substance Use Topics   Alcohol use: Yes    Alcohol/week: 2.0 standard drinks    Types: 2 Glasses of wine per week   Drug use: Yes    Types: Marijuana    Comment: 2 puffs on a joint daily.    Allergies as of 04/18/2021   (No Known Allergies)    Review of Systems:    All systems reviewed and negative except where noted in HPI.   Physical Exam:  Vital signs in last 24 hours: Temp:  [98.8 F (37.1 C)] 98.8 F (37.1 C) (07/01 0901) Pulse Rate:  [65-92] 92 (07/01 1305) Resp:  [18-23] 18 (07/01 1305) BP: (105-136)/(53-87) 120/87 (07/01 1305) SpO2:  [95 %-99 %] 96 % (07/01 1305) Weight:  [204.1 kg] 204.1 kg (07/01 0857)   General:   Pleasant, cooperative in NAD Head:  Normocephalic and atraumatic. Eyes:   No  icterus.   Conjunctiva pink. PERRLA. Ears:  Normal auditory acuity. Neck:  Supple; no masses or thyroidomegaly Lungs: Respirations even and unlabored. Lungs clear to auscultation bilaterally.   No wheezes, crackles, or rhonchi.  Heart:  Regular rate and rhythm;  Without murmur, clicks, rubs or gallops Abdomen:  Soft, nondistended, ruq tenderness . Normal bowel sounds. No appreciable masses  or hepatomegaly.  No rebound or guarding.  Neurologic:  Alert and oriented x3;  grossly normal neurologically. Skin:  Intact without significant lesions or rashes. Cervical Nodes:  No significant cervical adenopathy. Psych:  Alert and cooperative. Normal affect.  LAB RESULTS: Recent Labs    04/18/21 0917  WBC 11.8*  HGB 12.9  HCT 41.1  PLT 235   BMET Recent Labs    04/18/21 0917  NA 139  K 4.1  CL 107  CO2 27  GLUCOSE 178*  BUN 14  CREATININE 0.84  CALCIUM 9.3   LFT Recent Labs    04/18/21 0917  PROT 7.0  ALBUMIN 3.7  AST 834*  ALT 529*  ALKPHOS 166*  BILITOT 2.2*   PT/INR No results for input(s): LABPROT, INR in the last 72 hours.  STUDIES: CT ABDOMEN PELVIS WO CONTRAST  Result Date: 04/18/2021 CLINICAL DATA:  Right-sided abdominal pain. Some nausea and vomiting. EXAM: CT ABDOMEN AND PELVIS WITHOUT CONTRAST TECHNIQUE: Multidetector CT imaging of the abdomen and pelvis was performed following the standard protocol without IV contrast. COMPARISON:  Right upper quadrant ultrasound from same day. FINDINGS: Lower chest: No acute abnormality. Hepatobiliary: Diffusely decreased hepatic density. No focal abnormality. Multiple tiny gallstones. No gallbladder wall thickening or biliary dilatation. Pancreas: Unremarkable. No pancreatic ductal dilatation or surrounding inflammatory changes. Spleen: Normal in size without focal abnormality. Adrenals/Urinary Tract: Adrenal glands are unremarkable. Kidneys are normal, without renal calculi, focal lesion, or hydronephrosis. Bladder is  unremarkable. Stomach/Bowel: Postsurgical changes of the proximal stomach. No bowel wall thickening, distention, or surrounding inflammatory changes. Normal appendix. Vascular/Lymphatic: No significant vascular findings are present. No enlarged abdominal or pelvic lymph nodes. Reproductive: Small calcified uterine fibroids.  No adnexal mass. Other: Round 3.5 x 2.9 x 3.4 cm fat density lesion in the posterior pelvis (series 2, image 67). No free fluid or pneumoperitoneum. No abdominal wall hernia. Musculoskeletal: No acute or significant osseous findings. Severe lower lumbar facet arthropathy and bilateral hip osteoarthritis. IMPRESSION: 1. No acute intra-abdominal process. 2. Hepatic steatosis. 3. Cholelithiasis. 4. Nonspecific 3.5 cm fatty mass in the posterior pelvis without aggressive features. Follow-up CT in 6-12 months suggested to evaluate for interval change. Electronically Signed   By: Obie Dredge M.D.   On: 04/18/2021 12:35   US ABDOMEN LIMITED RUQ (LIVER/GB)  Result Date: 04/18/2021 CLINICAL DATA:  Onset right upper quadrant pain yesterday. EXAM: ULTRASOUND ABDOMEN LIMITED RIGHT UPPER QUADRANT COMPARISON:  None. FINDINGS: Gallbladder: A few tiny gravel type stones are seen layering dependently within the gallbladder. No gallbladder wall thickening or pericholecystic fluid. Sonographer reports negative Murphy's sign. Common bile duct: Diameter: 0.4 cm. Liver: No focal lesion. Echogenicity is increased. Portal vein is patent on color Doppler imaging with normal direction of blood flow towards the liver. Other: None. IMPRESSION: The patient has tiny, gravel type gallstones without evidence of cholecystitis. Fatty infiltration of the liver. Electronically Signed   By: Drusilla Kanner M.D.   On: 04/18/2021 10:53      Impression / Plan:   Alexandra Black is a 60 y.o. y/o female presents to the ER with acute onset abdominal pain radiating to the RUQ associated with nausea, elevated LFT's and T  bilirubin < 4.0 . Tenderness in RUQ. She has gall stones on USG and CBD< 6 mm. Pre test probability for a CBD stone is intermediate and hence needs a MRCP or EUS to r/o choledocholithiasis.She would also need eventually need a cholecystectomy. Our OR bed cannot hold her weight for surgery at  [facility] and our MRI machine cannot accommodate her weight . Recommend transfer to tertiary center. IF develops SIRS or features of cholangitis then start on antibiotics.  .   Thank you for involving me in the care of this patient.      LOS: 0 days   Wyline Mood, MD  04/18/2021, 1:20 PM

## 2021-04-18 NOTE — ED Notes (Signed)
Ultrasound at the bedside

## 2021-04-18 NOTE — ED Notes (Signed)
[facility]  CALLED  PER  BRADLER  MD

## 2021-04-18 NOTE — ED Notes (Signed)
Called  [name] For Alexandra Males, MD requesting GI

## 2021-04-19 ENCOUNTER — Emergency Department: Payer: Medicare HMO

## 2021-04-19 DIAGNOSIS — I480 Paroxysmal atrial fibrillation: Secondary | ICD-10-CM | POA: Diagnosis present

## 2021-04-19 DIAGNOSIS — E039 Hypothyroidism, unspecified: Secondary | ICD-10-CM

## 2021-04-19 DIAGNOSIS — I5032 Chronic diastolic (congestive) heart failure: Secondary | ICD-10-CM | POA: Diagnosis present

## 2021-04-19 DIAGNOSIS — K8309 Other cholangitis: Secondary | ICD-10-CM | POA: Diagnosis present

## 2021-04-19 DIAGNOSIS — R1011 Right upper quadrant pain: Secondary | ICD-10-CM

## 2021-04-19 DIAGNOSIS — R1900 Intra-abdominal and pelvic swelling, mass and lump, unspecified site: Secondary | ICD-10-CM | POA: Diagnosis present

## 2021-04-19 DIAGNOSIS — R109 Unspecified abdominal pain: Secondary | ICD-10-CM | POA: Diagnosis present

## 2021-04-19 DIAGNOSIS — K8050 Calculus of bile duct without cholangitis or cholecystitis without obstruction: Secondary | ICD-10-CM | POA: Diagnosis present

## 2021-04-19 DIAGNOSIS — J449 Chronic obstructive pulmonary disease, unspecified: Secondary | ICD-10-CM

## 2021-04-19 DIAGNOSIS — R945 Abnormal results of liver function studies: Secondary | ICD-10-CM

## 2021-04-19 DIAGNOSIS — F17200 Nicotine dependence, unspecified, uncomplicated: Secondary | ICD-10-CM | POA: Diagnosis present

## 2021-04-19 DIAGNOSIS — Z720 Tobacco use: Secondary | ICD-10-CM | POA: Diagnosis present

## 2021-04-19 DIAGNOSIS — R7989 Other specified abnormal findings of blood chemistry: Secondary | ICD-10-CM | POA: Diagnosis present

## 2021-04-19 LAB — LIPASE, BLOOD: Lipase: 20 U/L (ref 11–51)

## 2021-04-19 LAB — CBC WITH DIFFERENTIAL/PLATELET
Abs Immature Granulocytes: 0.05 10*3/uL (ref 0.00–0.07)
Basophils Absolute: 0 10*3/uL (ref 0.0–0.1)
Basophils Relative: 0 %
Eosinophils Absolute: 0.2 10*3/uL (ref 0.0–0.5)
Eosinophils Relative: 2 %
HCT: 35.2 % — ABNORMAL LOW (ref 36.0–46.0)
Hemoglobin: 11.1 g/dL — ABNORMAL LOW (ref 12.0–15.0)
Immature Granulocytes: 0 %
Lymphocytes Relative: 8 %
Lymphs Abs: 1 10*3/uL (ref 0.7–4.0)
MCH: 23.8 pg — ABNORMAL LOW (ref 26.0–34.0)
MCHC: 31.5 g/dL (ref 30.0–36.0)
MCV: 75.5 fL — ABNORMAL LOW (ref 80.0–100.0)
Monocytes Absolute: 1 10*3/uL (ref 0.1–1.0)
Monocytes Relative: 8 %
Neutro Abs: 9.9 10*3/uL — ABNORMAL HIGH (ref 1.7–7.7)
Neutrophils Relative %: 82 %
Platelets: 202 10*3/uL (ref 150–400)
RBC: 4.66 MIL/uL (ref 3.87–5.11)
RDW: 14.3 % (ref 11.5–15.5)
WBC: 12.3 10*3/uL — ABNORMAL HIGH (ref 4.0–10.5)
nRBC: 0 % (ref 0.0–0.2)

## 2021-04-19 LAB — URINALYSIS, COMPLETE (UACMP) WITH MICROSCOPIC
Glucose, UA: NEGATIVE mg/dL
Hgb urine dipstick: NEGATIVE
Ketones, ur: NEGATIVE mg/dL
Leukocytes,Ua: NEGATIVE
Nitrite: NEGATIVE
Protein, ur: NEGATIVE mg/dL
Specific Gravity, Urine: 1.014 (ref 1.005–1.030)
pH: 5 (ref 5.0–8.0)

## 2021-04-19 LAB — PROTIME-INR
INR: 1.2 (ref 0.8–1.2)
Prothrombin Time: 14.8 seconds (ref 11.4–15.2)

## 2021-04-19 LAB — CBG MONITORING, ED
Glucose-Capillary: 114 mg/dL — ABNORMAL HIGH (ref 70–99)
Glucose-Capillary: 117 mg/dL — ABNORMAL HIGH (ref 70–99)
Glucose-Capillary: 134 mg/dL — ABNORMAL HIGH (ref 70–99)

## 2021-04-19 LAB — COMPREHENSIVE METABOLIC PANEL
ALT: 429 U/L — ABNORMAL HIGH (ref 0–44)
AST: 321 U/L — ABNORMAL HIGH (ref 15–41)
Albumin: 3.1 g/dL — ABNORMAL LOW (ref 3.5–5.0)
Alkaline Phosphatase: 181 U/L — ABNORMAL HIGH (ref 38–126)
Anion gap: 9 (ref 5–15)
BUN: 11 mg/dL (ref 6–20)
CO2: 25 mmol/L (ref 22–32)
Calcium: 8.6 mg/dL — ABNORMAL LOW (ref 8.9–10.3)
Chloride: 103 mmol/L (ref 98–111)
Creatinine, Ser: 0.81 mg/dL (ref 0.44–1.00)
GFR, Estimated: >60 mL/min (ref >60)
Glucose, Bld: 117 mg/dL — ABNORMAL HIGH (ref 70–99)
Potassium: 4 mmol/L (ref 3.5–5.1)
Sodium: 137 mmol/L (ref 135–145)
Total Bilirubin: 6.1 mg/dL — ABNORMAL HIGH (ref 0.3–1.2)
Total Protein: 6.4 g/dL — ABNORMAL LOW (ref 6.5–8.1)

## 2021-04-19 LAB — HEPATITIS PANEL, ACUTE
HCV Ab: NONREACTIVE
Hep A IgM: NONREACTIVE
Hep B C IgM: NONREACTIVE
Hepatitis B Surface Ag: NONREACTIVE

## 2021-04-19 LAB — BRAIN NATRIURETIC PEPTIDE: B Natriuretic Peptide: 59.6 pg/mL (ref 0.0–100.0)

## 2021-04-19 LAB — HEPARIN LEVEL (UNFRACTIONATED)
Heparin Unfractionated: <0.1 IU/mL — ABNORMAL LOW (ref 0.30–0.70)
Heparin Unfractionated: 0.18 IU/mL — ABNORMAL LOW (ref 0.30–0.70)

## 2021-04-19 LAB — APTT: aPTT: 39 seconds — ABNORMAL HIGH (ref 24–36)

## 2021-04-19 LAB — LACTIC ACID, PLASMA: Lactic Acid, Venous: 0.9 mmol/L (ref 0.5–1.9)

## 2021-04-19 LAB — PROCALCITONIN: Procalcitonin: 0.4 ng/mL

## 2021-04-19 MED ORDER — HEPARIN BOLUS VIA INFUSION
3000.0000 [IU] | Freq: Once | INTRAVENOUS | Status: DC
  Filled 2021-04-19: qty 3000

## 2021-04-19 MED ORDER — SODIUM CHLORIDE 0.9 % IV SOLN: INTRAVENOUS | Status: DC

## 2021-04-19 MED ORDER — LEVOTHYROXINE SODIUM 50 MCG PO TABS
150.0000 ug | ORAL_TABLET | Freq: Every day | ORAL | Status: DC
  Administered 2021-04-19: 150 ug via ORAL
  Filled 2021-04-19: qty 3

## 2021-04-19 MED ORDER — INSULIN ASPART 100 UNIT/ML IJ SOLN: 0.0000 [IU] | Freq: Every day | INTRAMUSCULAR | Status: DC

## 2021-04-19 MED ORDER — HEPARIN (PORCINE) 25000 UT/250ML-% IV SOLN
1800.0000 [IU]/h | INTRAVENOUS | Status: DC
  Administered 2021-04-19: 1500 [IU]/h via INTRAVENOUS
  Filled 2021-04-19 (×2): qty 250

## 2021-04-19 MED ORDER — B COMPLEX-C PO TABS
1.0000 | ORAL_TABLET | Freq: Every day | ORAL | Status: DC
  Administered 2021-04-19: 1 via ORAL
  Filled 2021-04-19: qty 1

## 2021-04-19 MED ORDER — VITAMIN D 25 MCG (1000 UNIT) PO TABS
1000.0000 [IU] | ORAL_TABLET | Freq: Every day | ORAL | Status: DC
  Administered 2021-04-19: 1000 [IU] via ORAL
  Filled 2021-04-19: qty 1

## 2021-04-19 MED ORDER — ALBUTEROL SULFATE (2.5 MG/3ML) 0.083% IN NEBU: 2.5000 mg | INHALATION_SOLUTION | RESPIRATORY_TRACT | Status: DC | PRN

## 2021-04-19 MED ORDER — DM-GUAIFENESIN ER 30-600 MG PO TB12
1.0000 | ORAL_TABLET | Freq: Two times a day (BID) | ORAL | Status: DC
  Administered 2021-04-19: 1 via ORAL
  Filled 2021-04-19: qty 1

## 2021-04-19 MED ORDER — MORPHINE SULFATE (PF) 4 MG/ML IV SOLN
4.0000 mg | Freq: Once | INTRAVENOUS | Status: AC
  Administered 2021-04-19: 4 mg via INTRAVENOUS
  Filled 2021-04-19: qty 1

## 2021-04-19 MED ORDER — BIOTIN 1000 MCG PO CHEW: 1.0000 | CHEWABLE_TABLET | Freq: Every day | ORAL | Status: DC

## 2021-04-19 MED ORDER — VITAMIN B-12 1000 MCG PO TABS
5000.0000 ug | ORAL_TABLET | Freq: Every day | ORAL | Status: DC
  Administered 2021-04-19: 5000 ug via ORAL
  Filled 2021-04-19: qty 5

## 2021-04-19 MED ORDER — ELDERBERRY 500 MG PO CAPS: ORAL_CAPSULE | Freq: Every day | ORAL | Status: DC

## 2021-04-19 MED ORDER — ADULT MULTIVITAMIN W/MINERALS CH
1.0000 | ORAL_TABLET | Freq: Every day | ORAL | Status: DC
  Administered 2021-04-19: 1 via ORAL
  Filled 2021-04-19: qty 1

## 2021-04-19 MED ORDER — HEPARIN BOLUS VIA INFUSION
5000.0000 [IU] | Freq: Once | INTRAVENOUS | Status: AC
  Administered 2021-04-19: 5000 [IU] via INTRAVENOUS
  Filled 2021-04-19: qty 5000

## 2021-04-19 MED ORDER — MORPHINE SULFATE (PF) 2 MG/ML IV SOLN: 2.0000 mg | INTRAVENOUS | Status: DC | PRN

## 2021-04-19 MED ORDER — VENLAFAXINE HCL ER 150 MG PO CP24
150.0000 mg | ORAL_CAPSULE | Freq: Every day | ORAL | Status: DC
  Administered 2021-04-19: 150 mg via ORAL
  Filled 2021-04-19 (×2): qty 1

## 2021-04-19 MED ORDER — POLYETHYLENE GLYCOL 3350 17 G PO PACK: 17.0000 g | PACK | Freq: Every day | ORAL | Status: DC | PRN

## 2021-04-19 MED ORDER — PIPERACILLIN-TAZOBACTAM 3.375 G IVPB
3.3750 g | Freq: Three times a day (TID) | INTRAVENOUS | Status: DC
  Administered 2021-04-19 (×2): 3.375 g via INTRAVENOUS
  Filled 2021-04-19 (×2): qty 50

## 2021-04-19 MED ORDER — ONDANSETRON HCL 4 MG/2ML IJ SOLN: 4.0000 mg | Freq: Three times a day (TID) | INTRAMUSCULAR | Status: DC | PRN

## 2021-04-19 MED ORDER — ALLOPURINOL 100 MG PO TABS
100.0000 mg | ORAL_TABLET | Freq: Every day | ORAL | Status: DC
  Administered 2021-04-19: 100 mg via ORAL
  Filled 2021-04-19: qty 1

## 2021-04-19 MED ORDER — INSULIN ASPART 100 UNIT/ML IJ SOLN
0.0000 [IU] | Freq: Three times a day (TID) | INTRAMUSCULAR | Status: DC
  Administered 2021-04-19: 1 [IU] via SUBCUTANEOUS
  Filled 2021-04-19: qty 1

## 2021-04-19 MED ORDER — IPRATROPIUM-ALBUTEROL 0.5-2.5 (3) MG/3ML IN SOLN
3.0000 mL | Freq: Four times a day (QID) | RESPIRATORY_TRACT | Status: DC
  Administered 2021-04-19 (×2): 3 mL via RESPIRATORY_TRACT
  Filled 2021-04-19 (×2): qty 3

## 2021-04-19 MED ORDER — NICOTINE 21 MG/24HR TD PT24
21.0000 mg | MEDICATED_PATCH | Freq: Every day | TRANSDERMAL | Status: DC
  Filled 2021-04-19: qty 1

## 2021-04-19 MED ORDER — IBUPROFEN 400 MG PO TABS
400.0000 mg | ORAL_TABLET | Freq: Four times a day (QID) | ORAL | Status: DC | PRN
  Administered 2021-04-19: 400 mg via ORAL
  Filled 2021-04-19 (×2): qty 1

## 2021-04-19 NOTE — Consult Note (Signed)
ANTICOAGULATION CONSULT NOTE  Pharmacy Consult for IV Heparin Indication: atrial fibrillation  Patient Measurements: Height: 5' 2.5" (158.8 cm) Weight: (!) 204.1 kg (450 lb) IBW/kg (Calculated) : 51.25 Heparin Dosing Weight: 106.1 kg  Labs: Recent Labs    04/18/21 0917 04/18/21 1912  HGB 12.9 13.4  HCT 41.1 43.2  PLT 235 278  CREATININE 0.84 0.85    Estimated Creatinine Clearance: 126.5 mL/min (by C-G formula based on SCr of 0.85 mg/dL).   Medical History: Past Medical History:  Diagnosis Date   (HFpEF) heart failure with preserved ejection fraction (HCC)    a. 06/2019 Echo ([facility]): EF 55-60%; b. 04/2020 Echo: EF 55-60%, no rwma, Gr1 DD. Nl RV size/fxn. Mildly elev PASP. Triv MR.   Depression    Hypothyroidism    Iron deficiency    Morbid obesity (HCC)    a. s/p bariatric surgery in 2002 (pt unsure of type of surgery).   OSA (obstructive sleep apnea)    Osteoarthritis    PAF (paroxysmal atrial fibrillation) (HCC)    a. 12/2019 [facility]: Avg HR 84 (32-214). Sinus rhythm w/ brief runs of Afib (2% burden - avg 112 bpm, 47-187). Pauses up to 3.5 secs.   Paroxysmal Atrial flutter (HCC)    Vitamin B12 deficiency     Medications:  Xarelto 20 mg daily prior to admission. Last patient reported dose was 04/17/21  Assessment: Patient is a 60 y/o F with medical history as above and including atrial fibrillation on Xarelto who is admitted with concern for choledocholithiasis. Pharmacy has been consulted to initiate heparin infusion for stroke prophylaxis in Afib. Holding home Xarelto as patient will likely need invasive intervention.   Baseline H&H, platelets within normal limits. Baseline aPTT 39s, INR 1.2, heparin level < 0.10.  Goal of Therapy:  aPTT 66-102 seconds Monitor platelets by anticoagulation protocol: Yes   Plan:  --Heparin 5000 unit IV bolus followed by continuous infusion at 1500 units/hr --Check HL 6 hours after initiation of infusion --Daily CBC per protocol  while on IV heparin  Tressie Ellis 04/19/2021,7:47 AM

## 2021-04-19 NOTE — Consult Note (Signed)
Medical Consultation   Kebrina Friend  ZGY:[id number]  DOB: May 11, 1961  DOA: 04/18/2021  PCP: Oneita Hurt, No   Outpatient Specialists:    Requesting physician: -Dr. Dolores Frame and Dr.Jessup of ED  Reason for consultation: -abdominal pain, choledocholithiasis and possible cholangitis   History of Present Illness: Shalan Neault is an 60 y.o. female for 16 days morbid obesity with BMI 80.9, diabetes mellitus, COPD, GERD, hypothyroidism, gout, depression, PAF on Xarelto, tobacco abuse, OSA, dCHF, anemia, vitamin B12 deficiency, who presents with nausea, vomiting, abdominal pain.  Patient states that she has been having nausea, vomiting, abdominal for more than 2 days.  She says she had a just vomited once.  No diarrhea.  The abdominal pain is located in the right side of abdomen, worse in right upper quadrant, constant, sharp, severe, nonradiating.  Patient has low-grade subjective fever, no chills. She reports mild dry cough and mild shortness of breath, no chest pain.  No symptoms for UTI.  Patient was found to have abnormal liver function (ALP 230, AST 733, ALT 683, total bilirubin 4.6). CT scan showed cholelithiasis. US-RUQ showed tiny, gravel type gallstones without evidence of cholecystitis. Dr. Tobi Bastos of GI is consulted.    Per Dr. Tobi Bastos, "Pre test probability for a CBD stone is intermediate and hence needs a MRCP or EUS to r/o choledocholithiasis. She would also need eventually need a cholecystectomy. Our OR bed cannot hold her weight for surgery at [facility] and our MRI machine cannot accommodate her weight . Recommend transfer to tertiary center. IF develops SIRS or features of cholangitis then start on antibiotics". Pt is accepted to [facility] in [place]  ED course: Patient was found to have WBC 17.5, lipase 26, 40, negative COVID PCR, electrolytes renal function okay, abnormal liver function, temperature 99.4, blood pressure 96/58, heart rate 108, RR 23, oxygen saturation 91-92% on 2  L oxygen.  EKG: Reviewed independently, sinus rhythm, QTC 425, low voltage, poor R wave progression, T wave inversion in V3-V6 and lead III/aVF  Review of Systems:   General: has lower grade subjective fevers, no chills Skin: no rash HEENT: no blurry vision, hearing changes or sore throat Pulm: has dyspnea, coughing, no wheezing CV: no chest pain, palpitations, shortness of breath Abd: has nausea/vomiting, abdominal pain, no diarrhea/constipation GU: no dysuria, hematuria, polyuria Ext: no arthralgias, myalgias Neuro: no weakness, numbness, or tingling    Past Medical History: Past Medical History:  Diagnosis Date   (HFpEF) heart failure with preserved ejection fraction (HCC)    a. 06/2019 Echo ([facility]): EF 55-60%; b. 04/2020 Echo: EF 55-60%, no rwma, Gr1 DD. Nl RV size/fxn. Mildly elev PASP. Triv MR.   Depression    Hypothyroidism    Iron deficiency    Morbid obesity (HCC)    a. s/p bariatric surgery in 2002 (pt unsure of type of surgery).   OSA (obstructive sleep apnea)    Osteoarthritis    PAF (paroxysmal atrial fibrillation) (HCC)    a. 12/2019 Zio: Avg HR 84 (32-214). Sinus rhythm w/ brief runs of Afib (2% burden - avg 112 bpm, 47-187). Pauses up to 3.5 secs.   Paroxysmal Atrial flutter (HCC)    Vitamin B12 deficiency     Past Surgical History: Past Surgical History:  Procedure Laterality Date   BARIATRIC SURGERY  2002     Allergies:  No Known Allergies   Social History:  reports that she has been smoking. She has been  smoking an average of 1.00 packs per day. She has never used smokeless tobacco. She reports current alcohol use of about 2.0 standard drinks of alcohol per week. She reports current drug use. Drug: Marijuana.   Family History: Family History  Problem Relation Age of Onset   Heart disease Mother    Hypertension Mother    Peripheral Artery Disease Father    Obesity Sister    Obesity Brother    Obesity Sister    Depression Sister    Obesity  Sister     Physical Exam: Vitals:   04/19/21 0900 04/19/21 0915 04/19/21 0930 04/19/21 1000  BP:  119/86 104/64 121/78  Pulse:  85 86 79  Resp:  (!) 22  (!) 21  Temp:      TempSrc:      SpO2: 97% 97%  96%  Weight:      Height:         General: Not in acute distress.  Morbid obesity HEENT: PERRL, EOMI, no scleral icterus, No JVD or bruit Cardiac: S1/S2, RRR, No murmurs, gallops or rubs Pulm: Good air movement bilaterally. Clear to auscultation bilaterally. No rales, wheezing, rhonchi or rubs. Abd: Soft, nondistended,  has tenderness in right side of abdomen, worse on right upper quadrant, no rebound pain, no organomegaly, BS present Ext: No edema. 2+DP/PT pulse bilaterally Musculoskeletal: No joint deformities, erythema, or stiffness, ROM full Skin: No rashes.  Neuro: Alert and oriented X3, cranial nerves II-XII grossly intact, moves all extremities normally Psych: Patient is not psychotic, no suicidal or hemocidal ideation.   Data reviewed:  I have personally reviewed following labs and imaging studies Labs:  CBC: Recent Labs  Lab 04/18/21 0917 04/18/21 1912 04/19/21 0759  WBC 11.8* 17.5* 12.3*  NEUTROABS  --  15.2* 9.9*  HGB 12.9 13.4 11.1*  HCT 41.1 43.2 35.2*  MCV 75.6* 75.9* 75.5*  PLT 235 278 202    Basic Metabolic Panel: Recent Labs  Lab 04/18/21 0917 04/18/21 1912 04/19/21 0759  NA 139 138 137  K 4.1 4.5 4.0  CL 107 105 103  CO2 27 23 25   GLUCOSE 178* 148* 117*  BUN 14 14 11   CREATININE 0.84 0.85 0.81  CALCIUM 9.3 9.2 8.6*   GFR Estimated Creatinine Clearance: 132.7 mL/min (by C-G formula based on SCr of 0.81 mg/dL). Liver Function Tests: Recent Labs  Lab 04/18/21 0917 04/18/21 1912 04/19/21 0759  AST 834* 733* 321*  ALT 529* 683* 429*  ALKPHOS 166* 230* 181*  BILITOT 2.2* 4.6* 6.1*  PROT 7.0 7.9 6.4*  ALBUMIN 3.7 4.1 3.1*   Recent Labs  Lab 04/18/21 0917 04/18/21 1912 04/19/21 0759  LIPASE 26 40 20   No results for input(s):  AMMONIA in the last 168 hours. Coagulation profile Recent Labs  Lab 04/19/21 0759  INR 1.2    Cardiac Enzymes: No results for input(s): CKTOTAL, CKMB, CKMBINDEX, TROPONINI in the last 168 hours. BNP: Invalid input(s): POCBNP CBG: Recent Labs  Lab 04/19/21 0758  GLUCAP 114*   D-Dimer No results for input(s): DDIMER in the last 72 hours. Hgb A1c No results for input(s): HGBA1C in the last 72 hours. Lipid Profile No results for input(s): CHOL, HDL, LDLCALC, TRIG, CHOLHDL, LDLDIRECT in the last 72 hours. Thyroid function studies No results for input(s): TSH, T4TOTAL, T3FREE, THYROIDAB in the last 72 hours.  Invalid input(s): FREET3 Anemia work up No results for input(s): VITAMINB12, FOLATE, FERRITIN, TIBC, IRON, RETICCTPCT in the last 72 hours. Urinalysis  Component Value Date/Time   COLORURINE AMBER (A) 04/18/2021 0911   APPEARANCEUR HAZY (A) 04/18/2021 0911   LABSPEC 1.014 04/18/2021 0911   PHURINE 5.0 04/18/2021 0911   GLUCOSEU NEGATIVE 04/18/2021 0911   HGBUR NEGATIVE 04/18/2021 0911   BILIRUBINUR SMALL (A) 04/18/2021 0911   KETONESUR NEGATIVE 04/18/2021 0911   PROTEINUR NEGATIVE 04/18/2021 0911   NITRITE NEGATIVE 04/18/2021 0911   LEUKOCYTESUR NEGATIVE 04/18/2021 0911     Microbiology Recent Results (from the past 240 hour(s))  Resp Panel by RT-PCR (Flu A&B, Covid) Nasopharyngeal Swab     Status: None   Collection Time: 04/18/21  2:18 PM   Specimen: Nasopharyngeal Swab; Nasopharyngeal(NP) swabs in vial transport medium  Result Value Ref Range Status   SARS Coronavirus 2 by RT PCR NEGATIVE NEGATIVE Final    Comment: (NOTE) SARS-CoV-2 target nucleic acids are NOT DETECTED.  The SARS-CoV-2 RNA is generally detectable in upper respiratory specimens during the acute phase of infection. The lowest concentration of SARS-CoV-2 viral copies this assay can detect is 138 copies/mL. A negative result does not preclude SARS-Cov-2 infection and should not be used as  the sole basis for treatment or other patient management decisions. A negative result may occur with  improper specimen collection/handling, submission of specimen other than nasopharyngeal swab, presence of viral mutation(s) within the areas targeted by this assay, and inadequate number of viral copies(<138 copies/mL). A negative result must be combined with clinical observations, patient history, and epidemiological information. The expected result is Negative.  Fact Sheet for Patients:  BloggerCourse.com  Fact Sheet for Healthcare Providers:  SeriousBroker.it  This test is no t yet approved or cleared by the [place] FDA and  has been authorized for detection and/or diagnosis of SARS-CoV-2 by FDA under an Emergency Use Authorization (EUA). This EUA will remain  in effect (meaning this test can be used) for the duration of the COVID-19 declaration under Section 564(b)(1) of the Act, 21 U.S.C.section 360bbb-3(b)(1), unless the authorization is terminated  or revoked sooner.       Influenza A by PCR NEGATIVE NEGATIVE Final   Influenza B by PCR NEGATIVE NEGATIVE Final    Comment: (NOTE) The Xpert Xpress SARS-CoV-2/FLU/RSV plus assay is intended as an aid in the diagnosis of influenza from Nasopharyngeal swab specimens and should not be used as a sole basis for treatment. Nasal washings and aspirates are unacceptable for Xpert Xpress SARS-CoV-2/FLU/RSV testing.  Fact Sheet for Patients: BloggerCourse.com  Fact Sheet for Healthcare Providers: SeriousBroker.it  This test is not yet approved or cleared by the [place] FDA and has been authorized for detection and/or diagnosis of SARS-CoV-2 by FDA under an Emergency Use Authorization (EUA). This EUA will remain in effect (meaning this test can be used) for the duration of the COVID-19 declaration under Section 564(b)(1) of  the Act, 21 U.S.C. section 360bbb-3(b)(1), unless the authorization is terminated or revoked.  Performed at [facility], [address] [place], [place] [address]        Inpatient Medications:   Scheduled Meds:  allopurinol  100 mg Oral Daily   B-complex with vitamin C  1 tablet Oral Daily   cholecalciferol  1,000 Units Oral Daily   dextromethorphan-guaiFENesin  1 tablet Oral BID   insulin aspart  0-5 Units Subcutaneous QHS   insulin aspart  0-9 Units Subcutaneous TID WC   ipratropium-albuterol  3 mL Nebulization Q6H   levothyroxine  150 mcg Oral QAC breakfast   multivitamin with minerals  1 tablet  Oral Daily   nicotine  21 mg Transdermal Daily   venlafaxine XR  150 mg Oral Q breakfast   vitamin B-12  5,000 mcg Oral Daily   Continuous Infusions:  sodium chloride 75 mL/hr at 04/19/21 0932   heparin 1,500 Units/hr (04/19/21 0952)   piperacillin-tazobactam (ZOSYN)  IV Stopped (04/19/21 0645)     Radiological Exams on Admission: CT ABDOMEN PELVIS WO CONTRAST  Result Date: 04/18/2021 CLINICAL DATA:  Right-sided abdominal pain. Some nausea and vomiting. EXAM: CT ABDOMEN AND PELVIS WITHOUT CONTRAST TECHNIQUE: Multidetector CT imaging of the abdomen and pelvis was performed following the standard protocol without IV contrast. COMPARISON:  Right upper quadrant ultrasound from same day. FINDINGS: Lower chest: No acute abnormality. Hepatobiliary: Diffusely decreased hepatic density. No focal abnormality. Multiple tiny gallstones. No gallbladder wall thickening or biliary dilatation. Pancreas: Unremarkable. No pancreatic ductal dilatation or surrounding inflammatory changes. Spleen: Normal in size without focal abnormality. Adrenals/Urinary Tract: Adrenal glands are unremarkable. Kidneys are normal, without renal calculi, focal lesion, or hydronephrosis. Bladder is unremarkable. Stomach/Bowel: Postsurgical changes of the proximal stomach. No bowel wall thickening, distention, or  surrounding inflammatory changes. Normal appendix. Vascular/Lymphatic: No significant vascular findings are present. No enlarged abdominal or pelvic lymph nodes. Reproductive: Small calcified uterine fibroids.  No adnexal mass. Other: Round 3.5 x 2.9 x 3.4 cm fat density lesion in the posterior pelvis (series 2, image 67). No free fluid or pneumoperitoneum. No abdominal wall hernia. Musculoskeletal: No acute or significant osseous findings. Severe lower lumbar facet arthropathy and bilateral hip osteoarthritis. IMPRESSION: 1. No acute intra-abdominal process. 2. Hepatic steatosis. 3. Cholelithiasis. 4. Nonspecific 3.5 cm fatty mass in the posterior pelvis without aggressive features. Follow-up CT in 6-12 months suggested to evaluate for interval change. Electronically Signed   By: Obie Dredge M.D.   On: 04/18/2021 12:35   DG Chest Portable 1 View  Result Date: 04/19/2021 CLINICAL DATA:  Short of breath EXAM: PORTABLE CHEST 1 VIEW COMPARISON:  Prior chest x-ray 05/16/2020 FINDINGS: Cardiac and mediastinal contours likely exaggerated by portable frontal technique. Recent CT imaging did not demonstrate cardiomegaly. The lungs are grossly clear. No acute osseous abnormality. IMPRESSION: Exaggerated size of the cardiopericardial silhouette likely secondary to portable frontal technique. Recent CT imaging did not demonstrate evidence of cardiomegaly. The lungs are grossly clear. Electronically Signed   By: Malachy Moan M.D.   On: 04/19/2021 10:07   US ABDOMEN LIMITED RUQ (LIVER/GB)  Result Date: 04/18/2021 CLINICAL DATA:  Onset right upper quadrant pain yesterday. EXAM: ULTRASOUND ABDOMEN LIMITED RIGHT UPPER QUADRANT COMPARISON:  None. FINDINGS: Gallbladder: A few tiny gravel type stones are seen layering dependently within the gallbladder. No gallbladder wall thickening or pericholecystic fluid. Sonographer reports negative Murphy's sign. Common bile duct: Diameter: 0.4 cm. Liver: No focal lesion.  Echogenicity is increased. Portal vein is patent on color Doppler imaging with normal direction of blood flow towards the liver. Other: None. IMPRESSION: The patient has tiny, gravel type gallstones without evidence of cholecystitis. Fatty infiltration of the liver. Electronically Signed   By: Drusilla Kanner M.D.   On: 04/18/2021 10:53    Impression/Recommendations Principal Problem:   Abdominal pain Active Problems:   Depression   Hypothyroidism   COPD (chronic obstructive pulmonary disease) (HCC)   Type 2 diabetes mellitus without complication (HCC)   Chronic diastolic CHF (congestive heart failure) (HCC)   PAF (paroxysmal atrial fibrillation) (HCC)   Tobacco abuse   Mass of pelvis   Choledocholithiasis   Cholangitis   Abnormal LFTs  Abdominal pain due to possible choledocholithiasis and cholangitis and sepsis: Patient meets criteria for sepsis with leukocytosis with WBC 17.5, tachycardia with heart rate of 100, RR 23.  Lactic acid 0.9.  Blood pressure is soft, currently hemodynamically stable.  Patient will be transported to [facility] in [place].  -IV Zosyn -As needed Zofran, morphine -will get Procalcitonin and trend lactic acid levels per sepsis protocol. -IVF: 500 ml of NS bolus in ED, followed by 75 cc/h   Abnormal LFTs: Most likely due to choledocholithiasis -avoid using tylenol -As needed ibuprofen for fever  Depression -Continue Effexor  Hypothyroidism -Synthroid  COPD (chronic obstructive pulmonary disease) (HCC) -Bronchodilators  Type 2 diabetes mellitus without complication (HCC): Recent A1c 6.5, well controlled.  Patient is taking metformin -Sliding scale insulin  Chronic diastolic CHF (congestive heart failure) (HCC): 2D echo on 05/12/2020 showed EF of 55-60% with grade 1 diastolic dysfunction.  Is very difficult to assess volume status due to morbid obesity, does not seem to have CHF exacerbation -Hold Lasix and spironolactone due to sepsis  PAF  (paroxysmal atrial fibrillation) (HCC) -Switch Xarelto to IV heparin since patient may need procedure  Tobacco abuse -Nicotine patch  Mass of pelvis: CT showed nonspecific 3.5 cm fatty mass in the posterior pelvis without aggressive features -Follow-up with PCP.  -Follow-up CT in 6-12 months       Thank you for this consultation.  Our [facility] hospitalist team will follow the patient with you.   Time Spent: 30 min    Lorretta Harp M.D. [facility] 04/19/2021, 10:40 AM

## 2021-04-19 NOTE — ED Provider Notes (Signed)
-----------------------------------------   23:39 PM on 04/18/2021 -----------------------------------------  Care assumed from Dr. Vicente Males at change of shift.  In summary, this is a 61 year old morbidly obese patient presenting with upper abdominal pain, abnormal LFTs with concern for choledocholithiasis, unable to obtain MRI due to her body habitus.  Due to her size, both general surgery and GI at this facility recommended transfer to tertiary care center.  [place] were contacted and are at capacity.  Patient was accepted to the wait list at [facility] by Dr. Dion Body.  Dr. Vicente Males spoke with hospitalist services who was uncomfortable admitting patient overnight in the event she got a bed at [facility] quickly.  Will ask pharmacy to reconcile patient's medications and recommend frequency of IV Zosyn.  Will ask general medicine to consult in the a.m. and help manage patient medically while she is in the emergency department as I am told it would likely be 48 hours before her transfer due to high volume of patients on [name]'s waitlist.   ----------------------------------------- 6:53 AM on 04/19/2021 -----------------------------------------  No events overnight.  IV morphine given for headache and abdominal pain.  Have paged hospitalist services to evaluate patient in the emergency department.  Care will be transferred to the oncoming provider Dr. Larinda Buttery.   Irean Hong, MD 04/19/21 [phone]

## 2021-04-19 NOTE — Progress Notes (Signed)
Pharmacy Antibiotic Note  Alexandra Black is a 60 y.o. female admitted on 04/18/2021 with cholangitis.  Pharmacy has been consulted for Zosyn dosing.  Plan: Zosyn 3.375g IV q8h (4 hour infusion).  Pharmacy will continue to follow and will adjust abx dosing whenever warranted.  Height: 5' 2.5" (158.8 cm) Weight: (!) 204.1 kg (450 lb) IBW/kg (Calculated) : 51.25  Temp (24hrs), Avg:98.8 F (37.1 C), Min:98.8 F (37.1 C), Max:98.8 F (37.1 C)  Recent Labs  Lab 04/18/21 0917 04/18/21 1912  WBC 11.8* 17.5*  CREATININE 0.84 0.85    Estimated Creatinine Clearance: 126.5 mL/min (by C-G formula based on SCr of 0.85 mg/dL).    No Known Allergies  Antimicrobials this admission: 07/01 Zosyn >>   Microbiology results: No labs ordered or pending at this time.  Thank you for allowing pharmacy to be a part of this patient's care.  Otelia Sergeant, PharmD, [facility]  04/19/2021 6:55 AM

## 2021-04-19 NOTE — Consult Note (Addendum)
ANTICOAGULATION CONSULT NOTE  Pharmacy Consult for IV Heparin Indication: atrial fibrillation  Patient Measurements: Height: 5' 2.5" (158.8 cm) Weight: (!) 204.1 kg (450 lb) IBW/kg (Calculated) : 51.25 Heparin Dosing Weight: 106.1 kg  Labs: Recent Labs    04/18/21 0917 04/18/21 1912 04/19/21 0759  HGB 12.9 13.4 11.1*  HCT 41.1 43.2 35.2*  PLT 235 278 202  APTT  --   --  39*  LABPROT  --   --  14.8  INR  --   --  1.2  HEPARINUNFRC  --   --  <0.10*  CREATININE 0.84 0.85 0.81     Estimated Creatinine Clearance: 132.7 mL/min (by C-G formula based on SCr of 0.81 mg/dL).   Medical History: Past Medical History:  Diagnosis Date   (HFpEF) heart failure with preserved ejection fraction (HCC)    a. 06/2019 Echo ([facility]): EF 55-60%; b. 04/2020 Echo: EF 55-60%, no rwma, Gr1 DD. Nl RV size/fxn. Mildly elev PASP. Triv MR.   Depression    Hypothyroidism    Iron deficiency    Morbid obesity (HCC)    a. s/p bariatric surgery in 2002 (pt unsure of type of surgery).   OSA (obstructive sleep apnea)    Osteoarthritis    PAF (paroxysmal atrial fibrillation) (HCC)    a. 12/2019 Zio: Avg HR 84 (32-214). Sinus rhythm w/ brief runs of Afib (2% burden - avg 112 bpm, 47-187). Pauses up to 3.5 secs.   Paroxysmal Atrial flutter (HCC)    Vitamin B12 deficiency     Medications:  Xarelto 20 mg daily prior to admission. Last patient reported dose was 04/17/21  Assessment: Patient is a 60 y/o F with medical history as above and including atrial fibrillation on Xarelto who is admitted with concern for choledocholithiasis. Pharmacy has been consulted to initiate heparin infusion for stroke prophylaxis in Afib. Holding home Xarelto as patient will likely need invasive intervention.   Baseline H&H, platelets within normal limits. Baseline aPTT 39s, INR 1.2, heparin level < 0.10. Heparin infusion started at 1500 units/hr.   2952 1741 HL 0.18- subtherapeutic   Goal of Therapy:  aPTT 66-102  seconds Monitor platelets by anticoagulation protocol: Yes   Plan:  07/02 @ 1741 heparin level 0.18. Level is subtherapeutic. Will order heparin 3000 unit bolus and increase heparin infusion to 1800 units/hr.  Will recheck heparin level in 6 hours.  Daily CBC per protocol while on IV heparin  Gardner Candle, PharmD, BCPS Clinical Pharmacist 04/19/2021 6:34 PM

## 2021-04-19 NOTE — ED Provider Notes (Addendum)
Due to tertiary referral centers being at capacity with patient showing evidence of rising LFTs and T bili low-grade temperature have reached out to additional facilities.  [facility] capacity.  [facility] also capacity with multi day wait.  Have contacted [facility] in [place].    Willy Eddy, MD 04/19/21 [phone]  Spoke with Dr. Perlie Gold and Dr. Kellie Moor of GI at [facility].  Discussed her presentation as well as laboratory abnormalities concern for presentation of possible choledochal lithiasis mixed picture given her normal appearing common bile duct however her LFTs are worsening presenting with right upper quadrant pain and her evaluation here is limited as she is not able to be evaluated by MRCP due to her obesity.  They have kindly accepted patient for evaluation at their facility to reevaluate for possible ERCP recognizing that she is high risk and may not be a good candidate for this procedure but if she were to deteriorate we do not have much  we can offer at this facility Case discussed with Dr. Willaim Bane of hospitalist who is kindly excepted patient to [facility].   Willy Eddy, MD 04/19/21 1052

## 2021-04-20 DIAGNOSIS — R7401 Elevation of levels of liver transaminase levels: Secondary | ICD-10-CM | POA: Insufficient documentation

## 2021-04-20 DIAGNOSIS — K8020 Calculus of gallbladder without cholecystitis without obstruction: Secondary | ICD-10-CM | POA: Insufficient documentation

## 2021-04-22 LAB — HEMOGLOBIN A1C
Hgb A1c MFr Bld: 5.6 % (ref 4.8–5.6)
Mean Plasma Glucose: 114 mg/dL

## 2021-04-24 LAB — CULTURE, BLOOD (ROUTINE X 2)
Culture: NO GROWTH
Culture: NO GROWTH
Special Requests: ADEQUATE

## 2021-04-29 ENCOUNTER — Telehealth: Payer: Self-pay | Admitting: Cardiology

## 2021-04-29 NOTE — Telephone Encounter (Signed)
Patient c/o Palpitations:  High priority if patient c/o lightheadedness, shortness of breath, or chest pain  How long have you had palpitations/irregular HR/ Afib? Are you having the symptoms now? Yes, Day 3 - patient was just released from hospital and they changed her medication   Are you currently experiencing lightheadedness, SOB or CP? Lightheadedness, SOB, unable to sleep, exhausted   Do you have a history of afib (atrial fibrillation) or irregular heart rhythm? yes  Have you checked your BP or HR? (document readings if available): no  Are you experiencing any other symptoms? No

## 2021-04-29 NOTE — Telephone Encounter (Signed)
Spoke to pt. Pt discharged from [facility] on Friday 7/9.  Reports medication change: Diltiazem discontinued and started Metoprolol Succinate 50mg  daily.  States since Sunday 7/10, "started not feeling well, shortness of breath, light headedness, and heart is racing."  Symptoms constant per pt and present at this time.  Pt unable to check vitals at home.  Pt last seen by Dr. 05-03-1995 05/2020 and has h/o paroxysmal afib.  Pt was scheduled to see 06/2020, PA tomorrow 7/13, however, appt had to be cancelled d/t provider emergency. No open appointments in our office today. Pt refused appt 7/15 d/t appt conflict already in place with her PCP.  Advised pt d/t symptoms I would advise she go to the emergency room.  Pt voiced understanding and will have someone drive her to ER now.

## 2021-04-30 ENCOUNTER — Ambulatory Visit: Payer: Medicare HMO | Admitting: Physician Assistant

## 2021-05-04 DIAGNOSIS — K922 Gastrointestinal hemorrhage, unspecified: Secondary | ICD-10-CM | POA: Insufficient documentation

## 2021-05-04 DIAGNOSIS — D62 Acute posthemorrhagic anemia: Secondary | ICD-10-CM | POA: Insufficient documentation

## 2021-05-21 ENCOUNTER — Emergency Department
Admission: EM | Admit: 2021-05-21 | Discharge: 2021-05-21 | Disposition: A | Discharge status: Home / Self Care | Payer: Medicare HMO | Attending: Emergency Medicine | Admitting: Emergency Medicine

## 2021-05-21 ENCOUNTER — Emergency Department: Payer: Medicare HMO

## 2021-05-21 ENCOUNTER — Other Ambulatory Visit: Payer: Self-pay

## 2021-05-21 DIAGNOSIS — R1011 Right upper quadrant pain: Secondary | ICD-10-CM

## 2021-05-21 DIAGNOSIS — Z5321 Procedure and treatment not carried out due to patient leaving prior to being seen by health care provider: Secondary | ICD-10-CM | POA: Insufficient documentation

## 2021-05-21 LAB — COMPREHENSIVE METABOLIC PANEL
ALT: 25 U/L (ref 0–44)
AST: 29 U/L (ref 15–41)
Albumin: 4 g/dL (ref 3.5–5.0)
Alkaline Phosphatase: 118 U/L (ref 38–126)
Anion gap: 10 (ref 5–15)
BUN: 18 mg/dL (ref 6–20)
CO2: 26 mmol/L (ref 22–32)
Calcium: 10.2 mg/dL (ref 8.9–10.3)
Chloride: 102 mmol/L (ref 98–111)
Creatinine, Ser: 0.93 mg/dL (ref 0.44–1.00)
GFR, Estimated: >60 mL/min (ref >60)
Glucose, Bld: 119 mg/dL — ABNORMAL HIGH (ref 70–99)
Potassium: 4.4 mmol/L (ref 3.5–5.1)
Sodium: 138 mmol/L (ref 135–145)
Total Bilirubin: 0.9 mg/dL (ref 0.3–1.2)
Total Protein: 7.6 g/dL (ref 6.5–8.1)

## 2021-05-21 LAB — CBC WITH DIFFERENTIAL/PLATELET
Abs Immature Granulocytes: 0.05 10*3/uL (ref 0.00–0.07)
Basophils Absolute: 0.1 10*3/uL (ref 0.0–0.1)
Basophils Relative: 1 %
Eosinophils Absolute: 0.2 10*3/uL (ref 0.0–0.5)
Eosinophils Relative: 3 %
HCT: 37.3 % (ref 36.0–46.0)
Hemoglobin: 11.7 g/dL — ABNORMAL LOW (ref 12.0–15.0)
Immature Granulocytes: 1 %
Lymphocytes Relative: 32 %
Lymphs Abs: 3.1 10*3/uL (ref 0.7–4.0)
MCH: 24.2 pg — ABNORMAL LOW (ref 26.0–34.0)
MCHC: 31.4 g/dL (ref 30.0–36.0)
MCV: 77.1 fL — ABNORMAL LOW (ref 80.0–100.0)
Monocytes Absolute: 0.8 10*3/uL (ref 0.1–1.0)
Monocytes Relative: 9 %
Neutro Abs: 5.3 10*3/uL (ref 1.7–7.7)
Neutrophils Relative %: 54 %
Platelets: 322 10*3/uL (ref 150–400)
RBC: 4.84 MIL/uL (ref 3.87–5.11)
RDW: 16.4 % — ABNORMAL HIGH (ref 11.5–15.5)
WBC: 9.6 10*3/uL (ref 4.0–10.5)
nRBC: 0 % (ref 0.0–0.2)

## 2021-05-21 MED ORDER — FENTANYL CITRATE (PF) 100 MCG/2ML IJ SOLN
100.0000 ug | Freq: Once | INTRAMUSCULAR | Status: AC
  Administered 2021-05-21: 100 ug via INTRAVENOUS
  Filled 2021-05-21: qty 2

## 2021-05-21 MED ORDER — ONDANSETRON HCL 4 MG/2ML IJ SOLN
4.0000 mg | Freq: Once | INTRAMUSCULAR | Status: AC
  Administered 2021-05-21: 4 mg via INTRAVENOUS
  Filled 2021-05-21: qty 2

## 2021-05-21 NOTE — ED Notes (Signed)
First nurse=pt brought in via ems from home with ruq pain.  Iv in place.  bp 113/62, p-82. sats 99% room air per ems

## 2021-05-21 NOTE — ED Notes (Signed)
Pt called out from [facility]. Pt stated, "Yall have given me pain medicine. I want to lay down. I can go home and lay down. I am in a lot of pain and have told three people." Amy, first RN made aware that pt wants to leave. Pt does not have a ride and states "I will call a cab."

## 2021-05-21 NOTE — ED Notes (Signed)
Iv dc'ed.  Pt left without being seen.

## 2021-05-21 NOTE — ED Provider Notes (Signed)
Emergency Medicine Provider Triage Evaluation Note  Alexandra Black , a 60 y.o. female  was evaluated in triage.  Pt complains of right upper quadrant pain with nausea. Known cholelithiasis. Scheduled for surgery in September. Pain progressively increased. No relief with home meds. No known fever.   Review of Systems  Positive: RUQ pain. Positive for nausea. Negative: Fever, diarrhea  Physical Exam  BP 119/61 (BP Location: Right Wrist)   Pulse 62   Temp 97.6 F (36.4 C) (Oral)   Resp 20   Ht 5\' 2"  (1.575 m)   Wt (!) 178.7 kg   SpO2 99%   BMI 72.06 kg/m  Gen:   Awake, no distress   Resp:  Normal effort  MSK:   Moves extremities without difficulty  Other:    Medical Decision Making  Medically screening exam initiated at 4:34 PM.  Appropriate orders placed.  Keela Rubert was informed that the remainder of the evaluation will be completed by another provider, this initial triage assessment does not replace that evaluation, and the importance of remaining in the ED until their evaluation is complete.    Ennis Forts, FNP 05/21/21 1652    07/21/21, MD 05/21/21 [phone]

## 2021-05-21 NOTE — ED Triage Notes (Signed)
Pt to ER via [facility] with complaints of right sided abdominal pain. States she had a loose bowel movement when the pain started today. States she was recently hospitalized for the same and told she had a gall stone stuck in her bile duct. Was told she would need her gall bladder removed. Has an appointment with a surgeon next month.

## 2022-01-01 ENCOUNTER — Other Ambulatory Visit: Payer: Medicare HMO

## 2022-01-05 ENCOUNTER — Encounter: Payer: Self-pay | Admitting: Ophthalmology

## 2022-01-08 ENCOUNTER — Encounter: Payer: Self-pay | Admitting: Ophthalmology

## 2022-01-08 ENCOUNTER — Ambulatory Visit: Payer: Medicare HMO | Admitting: Anesthesiology

## 2022-01-08 ENCOUNTER — Other Ambulatory Visit: Payer: Self-pay

## 2022-01-08 ENCOUNTER — Ambulatory Visit
Admission: RE | Admit: 2022-01-08 | Discharge: 2022-01-08 | Disposition: A | Discharge status: Home / Self Care | Payer: Medicare HMO | Source: Ambulatory Visit | Attending: Ophthalmology | Admitting: Ophthalmology

## 2022-01-08 ENCOUNTER — Encounter
Admission: RE | Disposition: A | Discharge status: Home / Self Care | Payer: Self-pay | Source: Ambulatory Visit | Attending: Ophthalmology

## 2022-01-08 DIAGNOSIS — Z7901 Long term (current) use of anticoagulants: Secondary | ICD-10-CM | POA: Insufficient documentation

## 2022-01-08 DIAGNOSIS — F17210 Nicotine dependence, cigarettes, uncomplicated: Secondary | ICD-10-CM | POA: Diagnosis not present

## 2022-01-08 DIAGNOSIS — I5032 Chronic diastolic (congestive) heart failure: Secondary | ICD-10-CM | POA: Insufficient documentation

## 2022-01-08 DIAGNOSIS — Z6841 Body Mass Index (BMI) 40.0 and over, adult: Secondary | ICD-10-CM | POA: Insufficient documentation

## 2022-01-08 DIAGNOSIS — J449 Chronic obstructive pulmonary disease, unspecified: Secondary | ICD-10-CM | POA: Insufficient documentation

## 2022-01-08 DIAGNOSIS — I480 Paroxysmal atrial fibrillation: Secondary | ICD-10-CM | POA: Diagnosis not present

## 2022-01-08 DIAGNOSIS — Z9884 Bariatric surgery status: Secondary | ICD-10-CM | POA: Diagnosis not present

## 2022-01-08 DIAGNOSIS — H2511 Age-related nuclear cataract, right eye: Secondary | ICD-10-CM | POA: Diagnosis present

## 2022-01-08 DIAGNOSIS — F32A Depression, unspecified: Secondary | ICD-10-CM | POA: Insufficient documentation

## 2022-01-08 DIAGNOSIS — I110 Hypertensive heart disease with heart failure: Secondary | ICD-10-CM | POA: Insufficient documentation

## 2022-01-08 DIAGNOSIS — G4733 Obstructive sleep apnea (adult) (pediatric): Secondary | ICD-10-CM | POA: Diagnosis not present

## 2022-01-08 HISTORY — PX: CATARACT EXTRACTION W/PHACO: SHX586

## 2022-01-08 HISTORY — DX: Prediabetes: R73.03

## 2022-01-08 HISTORY — DX: Essential (primary) hypertension: I10

## 2022-01-08 HISTORY — DX: Chronic obstructive pulmonary disease, unspecified: J44.9

## 2022-01-08 SURGERY — PHACOEMULSIFICATION, CATARACT, WITH IOL INSERTION
Anesthesia: Monitor Anesthesia Care | Site: Eye | Laterality: Right

## 2022-01-08 MED ORDER — SODIUM CHLORIDE 0.9 % IV SOLN: INTRAVENOUS | Status: DC

## 2022-01-08 MED ORDER — TETRACAINE HCL 0.5 % OP SOLN
1.0000 [drp] | Freq: Once | OPHTHALMIC | Status: AC
  Administered 2022-01-08: 1 [drp] via OPHTHALMIC

## 2022-01-08 MED ORDER — MIDAZOLAM HCL 2 MG/2ML IJ SOLN
INTRAMUSCULAR | Status: DC | PRN
  Administered 2022-01-08: 1 mg via INTRAVENOUS
  Administered 2022-01-08: .5 mg via INTRAVENOUS

## 2022-01-08 MED ORDER — SIGHTPATH DOSE#1 BSS IO SOLN
INTRAOCULAR | Status: DC | PRN
  Administered 2022-01-08: 15 mL

## 2022-01-08 MED ORDER — SIGHTPATH DOSE#1 BSS IO SOLN
INTRAOCULAR | Status: DC | PRN
  Administered 2022-01-08: 1 mL

## 2022-01-08 MED ORDER — SODIUM CHLORIDE 0.9 % IV SOLN: INTRAVENOUS | Status: DC | PRN

## 2022-01-08 MED ORDER — FENTANYL CITRATE (PF) 100 MCG/2ML IJ SOLN
INTRAMUSCULAR | Status: AC
Start: 2022-01-08 — End: ?
  Filled 2022-01-08: qty 2

## 2022-01-08 MED ORDER — MOXIFLOXACIN HCL 0.5 % OP SOLN: 1.0000 [drp] | Freq: Once | OPHTHALMIC | Status: DC

## 2022-01-08 MED ORDER — ONDANSETRON HCL 4 MG/2ML IJ SOLN: 4.0000 mg | Freq: Once | INTRAMUSCULAR | Status: DC | PRN

## 2022-01-08 MED ORDER — MOXIFLOXACIN HCL 0.5 % OP SOLN
OPHTHALMIC | Status: DC | PRN
  Administered 2022-01-08: 0.2 mL via OPHTHALMIC

## 2022-01-08 MED ORDER — FENTANYL CITRATE (PF) 100 MCG/2ML IJ SOLN
INTRAMUSCULAR | Status: DC | PRN
  Administered 2022-01-08: 50 ug via INTRAVENOUS

## 2022-01-08 MED ORDER — MIDAZOLAM HCL 2 MG/2ML IJ SOLN
INTRAMUSCULAR | Status: AC
  Filled 2022-01-08: qty 2

## 2022-01-08 MED ORDER — SIGHTPATH DOSE#1 BSS IO SOLN
INTRAOCULAR | Status: DC | PRN
  Administered 2022-01-08: 48 mL via OPHTHALMIC

## 2022-01-08 MED ORDER — ARMC OPHTHALMIC DILATING DROPS
OPHTHALMIC | Status: AC
  Filled 2022-01-08: qty 0.5

## 2022-01-08 MED ORDER — MOXIFLOXACIN HCL 0.5 % OP SOLN
OPHTHALMIC | Status: AC
  Filled 2022-01-08: qty 3

## 2022-01-08 MED ORDER — ACETAMINOPHEN 325 MG PO TABS: 650.0000 mg | ORAL_TABLET | Freq: Once | ORAL | Status: DC | PRN

## 2022-01-08 MED ORDER — SIGHTPATH DOSE#1 NA CHONDROIT SULF-NA HYALURON 40-17 MG/ML IO SOLN
INTRAOCULAR | Status: DC | PRN
  Administered 2022-01-08: 1 mL via INTRAOCULAR

## 2022-01-08 MED ORDER — BRIMONIDINE TARTRATE-TIMOLOL 0.2-0.5 % OP SOLN
OPHTHALMIC | Status: DC | PRN
Start: 2022-01-08 — End: 2022-01-08
  Administered 2022-01-08: 1 [drp] via OPHTHALMIC

## 2022-01-08 MED ORDER — TRYPAN BLUE 0.06 % IO SOSY
PREFILLED_SYRINGE | INTRAOCULAR | Status: DC | PRN
  Administered 2022-01-08: 0.5 mL via INTRAOCULAR

## 2022-01-08 MED ORDER — ARMC OPHTHALMIC DILATING DROPS
1.0000 "application " | OPHTHALMIC | Status: AC
  Administered 2022-01-08 (×3): 1 via OPHTHALMIC

## 2022-01-08 MED ORDER — ACETAMINOPHEN 160 MG/5ML PO SOLN
325.0000 mg | ORAL | Status: DC | PRN
  Filled 2022-01-08: qty 20.3

## 2022-01-08 MED ORDER — TETRACAINE HCL 0.5 % OP SOLN
OPHTHALMIC | Status: AC
  Administered 2022-01-08: 1 [drp] via OPHTHALMIC
  Filled 2022-01-08: qty 4

## 2022-01-08 SURGICAL SUPPLY — 18 items
CANNULA ANT/CHMB 27G (MISCELLANEOUS) IMPLANT
CANNULA ANT/CHMB 27GA (MISCELLANEOUS) ×2 IMPLANT
CATARACT SUITE SIGHTPATH (MISCELLANEOUS) ×2 IMPLANT
FEE CATARACT SUITE SIGHTPATH (MISCELLANEOUS) ×1 IMPLANT
GLOVE SURG ENC MOIS LTX SZ8 (GLOVE) ×2 IMPLANT
GLOVE SURG ENC TEXT LTX SZ6.5 (GLOVE) ×2 IMPLANT
GLOVE SURG ENC TEXT LTX SZ8 (GLOVE) ×2 IMPLANT
GOWN STRL REUS W/ TWL LRG LVL3 (GOWN DISPOSABLE) ×2 IMPLANT
GOWN STRL REUS W/TWL LRG LVL3 (GOWN DISPOSABLE) ×4
LENS IOL TECNIS EYHANCE 19.5 (Intraocular Lens) ×1 IMPLANT
NDL FILTER BLUNT 18X1 1/2 (NEEDLE) ×1 IMPLANT
NEEDLE FILTER BLUNT 18X 1/2SAF (NEEDLE) ×1
NEEDLE FILTER BLUNT 18X1 1/2 (NEEDLE) ×1 IMPLANT
SYR 3ML LL SCALE MARK (SYRINGE) ×2 IMPLANT
SYR 5ML LL (SYRINGE) IMPLANT
SYR TB 1ML 27GX1/2 LL (SYRINGE) ×2 IMPLANT
WATER STERILE IRR 250ML POUR (IV SOLUTION) ×2 IMPLANT
WIPE NON LINTING 3.25X3.25 (MISCELLANEOUS) IMPLANT

## 2022-01-08 NOTE — Anesthesia Postprocedure Evaluation (Signed)
Anesthesia Post Note ? ?Patient: Alexandra Black ? ?Procedure(s) Performed: CATARACT EXTRACTION PHACO AND INTRAOCULAR LENS PLACEMENT (IOC) RIGHT DIABETIC (Right: Eye) ? ?Patient location during evaluation: PACU ?Anesthesia Type: MAC ?Level of consciousness: awake and alert, oriented and patient cooperative ?Pain management: pain level controlled ?Vital Signs Assessment: post-procedure vital signs reviewed and stable ?Respiratory status: spontaneous breathing, nonlabored ventilation and respiratory function stable ?Cardiovascular status: blood pressure returned to baseline and stable ?Postop Assessment: adequate PO intake ?Anesthetic complications: no ? ? ?No notable events documented. ? ? ?Last Vitals:  ?Vitals:  ? 01/08/22 1049 01/08/22 1212  ?BP: (!) 105/51 116/87  ?Pulse: (!) 58 60  ?Resp: 18 16  ?Temp: (!) 36.1 ?C (!) 36.3 ?C  ?SpO2: 98% 100%  ?  ?Last Pain:  ?Vitals:  ? 01/08/22 1212  ?TempSrc: Temporal  ?PainSc: 0-No pain  ? ? ?  ?  ?  ?  ?  ?  ? ?Darrin Nipper ? ? ? ? ?

## 2022-01-08 NOTE — Discharge Instructions (Addendum)
Eye Surgery Discharge Instructions ? ? ? ?Expect mild scratchy sensation or mild soreness. ?DO NOT RUB YOUR EYE! ? ?The day of surgery: ?Minimal physical activity, but bed rest is not required ?No reading, computer work, or close hand work ?No bending, lifting, or straining. ?May watch TV ? ?For 24 hours: ?No driving, legal decisions, or alcoholic beverages ?Safety precautions ?Eat anything you prefer: It is better to start with liquids, then soup then solid foods. ?_____ Eye patch should be worn until postoperative exam tomorrow. ?__x__ Solar shield eyeglasses should be worn for comfort in the sunlight/patch while sleeping ? ?Resume all regular medications including aspirin or Coumadin if these were discontinued prior to surgery. ?You may shower, bathe, shave, or wash your hair. ?Tylenol may be taken for mild discomfort. ? ?Call your doctor if you experience significant pain, nausea, or vomiting, fever > 101 or other signs of infection. [phone] or [phone] ?Specific instructions: ? ? Follow-up Information   ? ? Porfilio, William, MD Follow up.   ?Specialty: Ophthalmology ?Why: as scheduled ?Contact information: ?[address] ?[place] [address] ?[phone] ? ? ?  ?  ? ?  ?  ? ?  ?  ?

## 2022-01-08 NOTE — Anesthesia Preprocedure Evaluation (Signed)
Anesthesia Evaluation  ?Patient identified by MRN, date of birth, ID band ?Patient awake ? ? ? ?Reviewed: ?Allergy & Precautions, NPO status , Patient's Chart, lab work & pertinent test results ? ?History of Anesthesia Complications ?Negative for: history of anesthetic complications ? ?Airway ?Mallampati: III ? ? ?Neck ROM: Full ? ? ? Dental ? ?(+) Chipped, Missing ?  ?Pulmonary ?sleep apnea , COPD, Current Smoker (1/2 ppd) and Patient abstained from smoking.,  ?  ?Pulmonary exam normal ?breath sounds clear to auscultation ? ? ? ? ? ? Cardiovascular ?hypertension, Normal cardiovascular exam+ dysrhythmias (paroxysmal a fib on Xarelto)  ?Rhythm:Regular Rate:Normal ? ? ?  ?Neuro/Psych ?PSYCHIATRIC DISORDERS Depression   ? GI/Hepatic ?GERD  ,S/p bariatric surgery ?  ?Endo/Other  ?Prediabetes, class 3 obesity ? Renal/GU ?negative Renal ROS  ? ?  ?Musculoskeletal ? ?(+) Arthritis ,  ? Abdominal ?  ?Peds ? Hematology ?negative hematology ROS ?(+)   ?Anesthesia Other Findings ? ? Reproductive/Obstetrics ? ?  ? ? ? ? ? ? ? ? ? ? ? ? ? ?  ?  ? ? ? ? ? ? ? ? ?Anesthesia Physical ?Anesthesia Plan ? ?ASA: 3 ? ?Anesthesia Plan: MAC  ? ?Post-op Pain Management:   ? ?Induction: Intravenous ? ?PONV Risk Score and Plan: 1 and TIVA and Treatment may vary due to age or medical condition ? ?Airway Management Planned: Natural Airway ? ?Additional Equipment:  ? ?Intra-op Plan:  ? ?Post-operative Plan:  ? ?Informed Consent: I have reviewed the patients History and Physical, chart, labs and discussed the procedure including the risks, benefits and alternatives for the proposed anesthesia with the patient or authorized representative who has indicated his/her understanding and acceptance.  ? ? ? ? ? ?Plan Discussed with: CRNA ? ?Anesthesia Plan Comments: (LMA/GETA backup discussed.  Patient consented for risks of anesthesia including but not limited to:  ?- adverse reactions to medications ?- damage to eyes,  teeth, lips or other oral mucosa ?- nerve damage due to positioning  ?- sore throat or hoarseness ?- damage to heart, brain, nerves, lungs, other parts of body or loss of life ? ?Informed patient about role of CRNA in peri- and intra-operative care.  Patient voiced understanding.)  ? ? ? ? ? ? ?Anesthesia Quick Evaluation ? ?

## 2022-01-08 NOTE — Transfer of Care (Addendum)
Immediate Anesthesia Transfer of Care Note ? ?Patient: Alexandra Black ? ?Procedure(s) Performed: CATARACT EXTRACTION PHACO AND INTRAOCULAR LENS PLACEMENT (IOC) RIGHT DIABETIC (Right: Eye) ? ?Patient Location: PACU ? ?Anesthesia Type:MAC ? ?Level of Consciousness: awake ? ?Airway & Oxygen Therapy: Patient Spontanous Breathing ? ?Post-op Assessment: Report given to RN ? ?Post vital signs: Reviewed and stable ? ?Last Vitals:  ?Vitals Value Taken Time  ?BP    ?Temp    ?Pulse    ?Resp    ?SpO2    ? ? ?Last Pain:  ?Vitals:  ? 01/08/22 1049  ?TempSrc: Temporal  ?PainSc: 10-Worst pain ever  ?   ? ?  ? ?Complications: No notable events documented. ?

## 2022-01-08 NOTE — Op Note (Signed)
PREOPERATIVE DIAGNOSIS:  Nuclear sclerotic cataract of the right eye. ?  ?POSTOPERATIVE DIAGNOSIS:  Cataract ?  ?OPERATIVE PROCEDURE:ORPROCALL@ ?  ?SURGEON:  Galen Manila, MD. ?  ?ANESTHESIA: ? ?Anesthesiologist: Reed Breech, MD ?CRNA: Philbert Riser, CRNA ? ?1.      Managed anesthesia care. ?2.      0.61ml of Shugarcaine was instilled in the eye following the paracentesis. ?  ?COMPLICATIONS: Vision Blue was used to stain the anterior capsule due to very poor/ no visualization of the red reflex. ? ?  ?TECHNIQUE:   Stop and chop ?  ?DESCRIPTION OF PROCEDURE:  The patient was examined and consented in the preoperative holding area where the aforementioned topical anesthesia was applied to the right eye and then brought back to the Operating Room where the right eye was prepped and draped in the usual sterile ophthalmic fashion and a lid speculum was placed. A paracentesis was created with the side port blade and the anterior chamber was filled with viscoelastic. A near clear corneal incision was performed with the steel keratome. A continuous curvilinear capsulorrhexis was performed with a cystotome followed by the capsulorrhexis forceps. Hydrodissection and hydrodelineation were carried out with BSS on a blunt cannula. The lens was removed in a stop and chop  technique and the remaining cortical material was removed with the irrigation-aspiration handpiece. The capsular bag was inflated with viscoelastic and the Technis [id number]  lens was placed in the capsular bag without complication. The remaining viscoelastic was removed from the eye with the irrigation-aspiration handpiece. The wounds were hydrated. The anterior chamber was flushed with BSS and the eye was inflated to physiologic pressure. 0.61ml of Vigamox was placed in the anterior chamber. The wounds were found to be water tight. The eye was dressed with Combigan. The patient was given protective glasses to wear throughout the day and a shield with which to  sleep tonight. The patient was also given drops with which to begin a drop regimen today and will follow-up with me in one day. ?Implant Name Type Inv. Item Serial No. Manufacturer Lot No. LRB No. Used Action  ?LENS IOL TECNIS EYHANCE 19.5 - [id number] Intraocular Lens LENS IOL TECNIS EYHANCE 19.5 [id number] [employer]  Right 1 Implanted  ? ?Procedure(s): ?CATARACT EXTRACTION PHACO AND INTRAOCULAR LENS PLACEMENT (IOC) RIGHT DIABETIC (Right) ? ?Electronically signed: Galen Manila 01/08/2022 12:09 PM ? ?

## 2022-01-08 NOTE — H&P (Signed)
[facility]  ? ?Primary Care Physician:  Pcp, No ?Ophthalmologist: Dr. Willey Blade ? ?Pre-Procedure History & Physical: ?HPI:  Alexandra Black is a 61 y.o. female here for cataract surgery. ?  ?Past Medical History:  ?Diagnosis Date  ? (HFpEF) heart failure with preserved ejection fraction (HCC)   ? a. 06/2019 Echo ([facility]): EF 55-60%; b. 04/2020 Echo: EF 55-60%, no rwma, Gr1 DD. Nl RV size/fxn. Mildly elev PASP. Triv MR.  ? COPD (chronic obstructive pulmonary disease) (HCC)   ? Depression   ? Hypertension   ? Hypothyroidism   ? Iron deficiency   ? Morbid obesity (HCC)   ? a. s/p bariatric surgery in 2002 (pt unsure of type of surgery).  ? OSA (obstructive sleep apnea)   ? Osteoarthritis   ? PAF (paroxysmal atrial fibrillation) (HCC)   ? a. 12/2019 [facility]: Avg HR 84 (32-214). Sinus rhythm w/ brief runs of Afib (2% burden - avg 112 bpm, 47-187). Pauses up to 3.5 secs.  ? Paroxysmal Atrial flutter (HCC)   ? Pre-diabetes   ? Vitamin B12 deficiency   ? ? ?Past Surgical History:  ?Procedure Laterality Date  ? BARIATRIC SURGERY  2002  ? ? ?Prior to Admission medications   ?Medication Sig Start Date End Date Taking? Authorizing Provider  ?allopurinol (ZYLOPRIM) 100 MG tablet Take 100 mg by mouth daily. 12/23/20  Yes [provider]  ?b complex vitamins tablet Take 1 tablet by mouth daily.   Yes [provider]  ?Biotin 1000 MCG CHEW Chew by mouth.   Yes [provider]  ?Cyanocobalamin (B-12) 5000 MCG CAPS Take by mouth daily.   Yes [provider]  ?ELDERBERRY PO Take by mouth daily.   Yes [provider]  ?levothyroxine (SYNTHROID) 150 MCG tablet Take 150 mcg by mouth daily before breakfast.   Yes [provider]  ?lisinopril (ZESTRIL) 10 MG tablet Take 10 mg by mouth daily. 12/23/20  Yes [provider]  ?Multiple Vitamin (MULTI-VITAMIN) tablet Take 1 tablet by mouth daily.   Yes [provider]  ?polyethylene glycol (MIRALAX / GLYCOLAX) 17 g  packet Take 17 g by mouth daily as needed for mild constipation. 05/17/20  Yes Alwyn Ren, MD  ?potassium chloride (MICRO-K) 10 MEQ CR capsule Take 10 mEq by mouth daily.   Yes [provider]  ?rivaroxaban (XARELTO) 20 MG TABS tablet Take 20 mg by mouth daily with supper.   Yes [provider]  ?spironolactone (ALDACTONE) 25 MG tablet Take 25 mg by mouth daily. 12/23/20  Yes [provider]  ?venlafaxine XR (EFFEXOR-XR) 150 MG 24 hr capsule Take 150 mg by mouth daily with breakfast.   Yes [provider]  ?Cholecalciferol 25 MCG (1000 UT) capsule Take 1,000 Units by mouth daily.    [provider]  ?diltiazem (CARDIZEM CD) 120 MG 24 hr capsule Take 120 mg by mouth daily. 12/23/20   [provider]  ?furosemide (LASIX) 40 MG tablet Take 1 tablet (40 mg total) by mouth daily. 05/17/20 05/17/21  Alwyn Ren, MD  ?guaiFENesin (MUCINEX) 600 MG 12 hr tablet Take 600-1,200 mg by mouth 2 (two) times daily as needed. ?Patient not taking: Reported on 04/19/2021    [provider]  ?ipratropium-albuterol (DUONEB) 0.5-2.5 (3) MG/3ML SOLN Take 3 mLs by nebulization every 6 (six) hours as needed.    [provider]  ?metFORMIN (GLUCOPHAGE) 500 MG tablet Take 500 mg by mouth 2 (two) times daily with a meal.  [provider]  ?predniSONE (DELTASONE) 10 MG tablet Take 4 tablets daily for 3 days  ?Then 3 tablets daily for 3 days  ?Then 2 tablets daily for 3 days then 1 tablet daily  Till done ?Patient not taking: Reported on 04/19/2021 05/17/20   Alwyn Ren, MD  ?senna-docusate (SENOKOT-S) 8.6-50 MG tablet Take 1 tablet by mouth 2 (two) times daily. ?Patient not taking: Reported on 04/19/2021 05/17/20   Alwyn Ren, MD  ?vitamin E 180 MG (400 UNITS) capsule Take by mouth. ?Patient not taking: Reported on 04/19/2021    [provider]  ? ? ?Allergies as of 09/30/2021  ? (No Known Allergies)  ? ? ?Family History  ?Problem  Relation Age of Onset  ? Heart disease Mother   ? Hypertension Mother   ? Peripheral Artery Disease Father   ? Obesity Sister   ? Obesity Brother   ? Obesity Sister   ? Depression Sister   ? Obesity Sister   ? ? ?Social History  ? ?Socioeconomic History  ? Marital status: Divorced  ?  Spouse name: Not on file  ? Number of children: Not on file  ? Years of education: Not on file  ? Highest education level: Not on file  ?Occupational History  ? Not on file  ?Tobacco Use  ? Smoking status: Every Day  ?  Packs/day: 1.00  ?  Types: Cigarettes  ? Smokeless tobacco: Never  ? Tobacco comments:  ?  Currently smoking 1 ppd.  Quit for 20 yrs between ages of ~ [age].  ?Substance and Sexual Activity  ? Alcohol use: Yes  ?  Alcohol/week: 2.0 standard drinks  ?  Types: 2 Glasses of wine per week  ? Drug use: Yes  ?  Types: Marijuana  ?  Comment: 2 puffs on a joint daily.  ? Sexual activity: Not Currently  ?Other Topics Concern  ? Not on file  ?Social History Narrative  ? Lives locally with two cats and a dog.  Family nearby.  Previously lived in [place].  Disabled x 13 yrs.  Has MBA in [employer] but says she never had an opportunity to use it.  Ambulates w/ walker.  ? ?Social Determinants of Health  ? ?Financial Resource Strain: Not on file  ?Food Insecurity: Not on file  ?Transportation Needs: Not on file  ?Physical Activity: Not on file  ?Stress: Not on file  ?Social Connections: Not on file  ?Intimate Partner Violence: Not on file  ? ? ?Review of Systems: ?See HPI, otherwise negative ROS ? ?Physical Exam: ?BP (!) 105/51   Pulse (!) 58   Temp (!) 97 ?F (36.1 ?C) (Temporal)   Resp 18   Ht 5' 2.5" (1.588 m)   Wt (!) 187.3 kg   SpO2 98%   BMI 74.33 kg/m?  ?General:   Alert, cooperative in NAD ?Head:  Normocephalic and atraumatic. ?Respiratory:  Normal work of breathing. ?Cardiovascular:  RRR ? ?Impression/Plan: ?Alexandra Black is here for cataract surgery. ? ?Risks, benefits, limitations, and alternatives  regarding cataract surgery have been reviewed with the patient.  Questions have been answered.  All parties agreeable. ? ? ?Galen Manila, MD  01/08/2022, 11:32 AM ? ? ?

## 2022-01-13 ENCOUNTER — Encounter: Payer: Self-pay | Admitting: Ophthalmology

## 2022-02-10 DIAGNOSIS — F320 Major depressive disorder, single episode, mild: Secondary | ICD-10-CM | POA: Insufficient documentation

## 2022-02-10 DIAGNOSIS — K219 Gastro-esophageal reflux disease without esophagitis: Secondary | ICD-10-CM

## 2022-02-10 DIAGNOSIS — J449 Chronic obstructive pulmonary disease, unspecified: Secondary | ICD-10-CM

## 2022-02-10 DIAGNOSIS — G4733 Obstructive sleep apnea (adult) (pediatric): Secondary | ICD-10-CM

## 2022-02-10 DIAGNOSIS — F32A Depression, unspecified: Secondary | ICD-10-CM

## 2022-02-10 DIAGNOSIS — I10 Essential (primary) hypertension: Secondary | ICD-10-CM | POA: Insufficient documentation

## 2022-02-10 DIAGNOSIS — H2512 Age-related nuclear cataract, left eye: Secondary | ICD-10-CM | POA: Insufficient documentation

## 2022-02-13 ENCOUNTER — Ambulatory Visit: Payer: Medicare HMO | Admitting: Cardiology

## 2022-03-23 ENCOUNTER — Ambulatory Visit: Payer: Medicare HMO | Admitting: Cardiology

## 2022-03-30 ENCOUNTER — Ambulatory Visit: Payer: Medicare HMO | Admitting: Cardiology

## 2022-05-26 ENCOUNTER — Encounter: Payer: Self-pay | Admitting: Cardiology

## 2022-05-26 ENCOUNTER — Ambulatory Visit: Payer: Medicare HMO | Admitting: Cardiology

## 2022-06-03 IMAGING — DX DG CHEST 1V PORT
1 series · 1 of 1 positions shown · non-contrast
Comparison: None.

CLINICAL DATA: Shortness of breath beginning last night.

EXAM:
PORTABLE CHEST 1 VIEW

[chest ap]
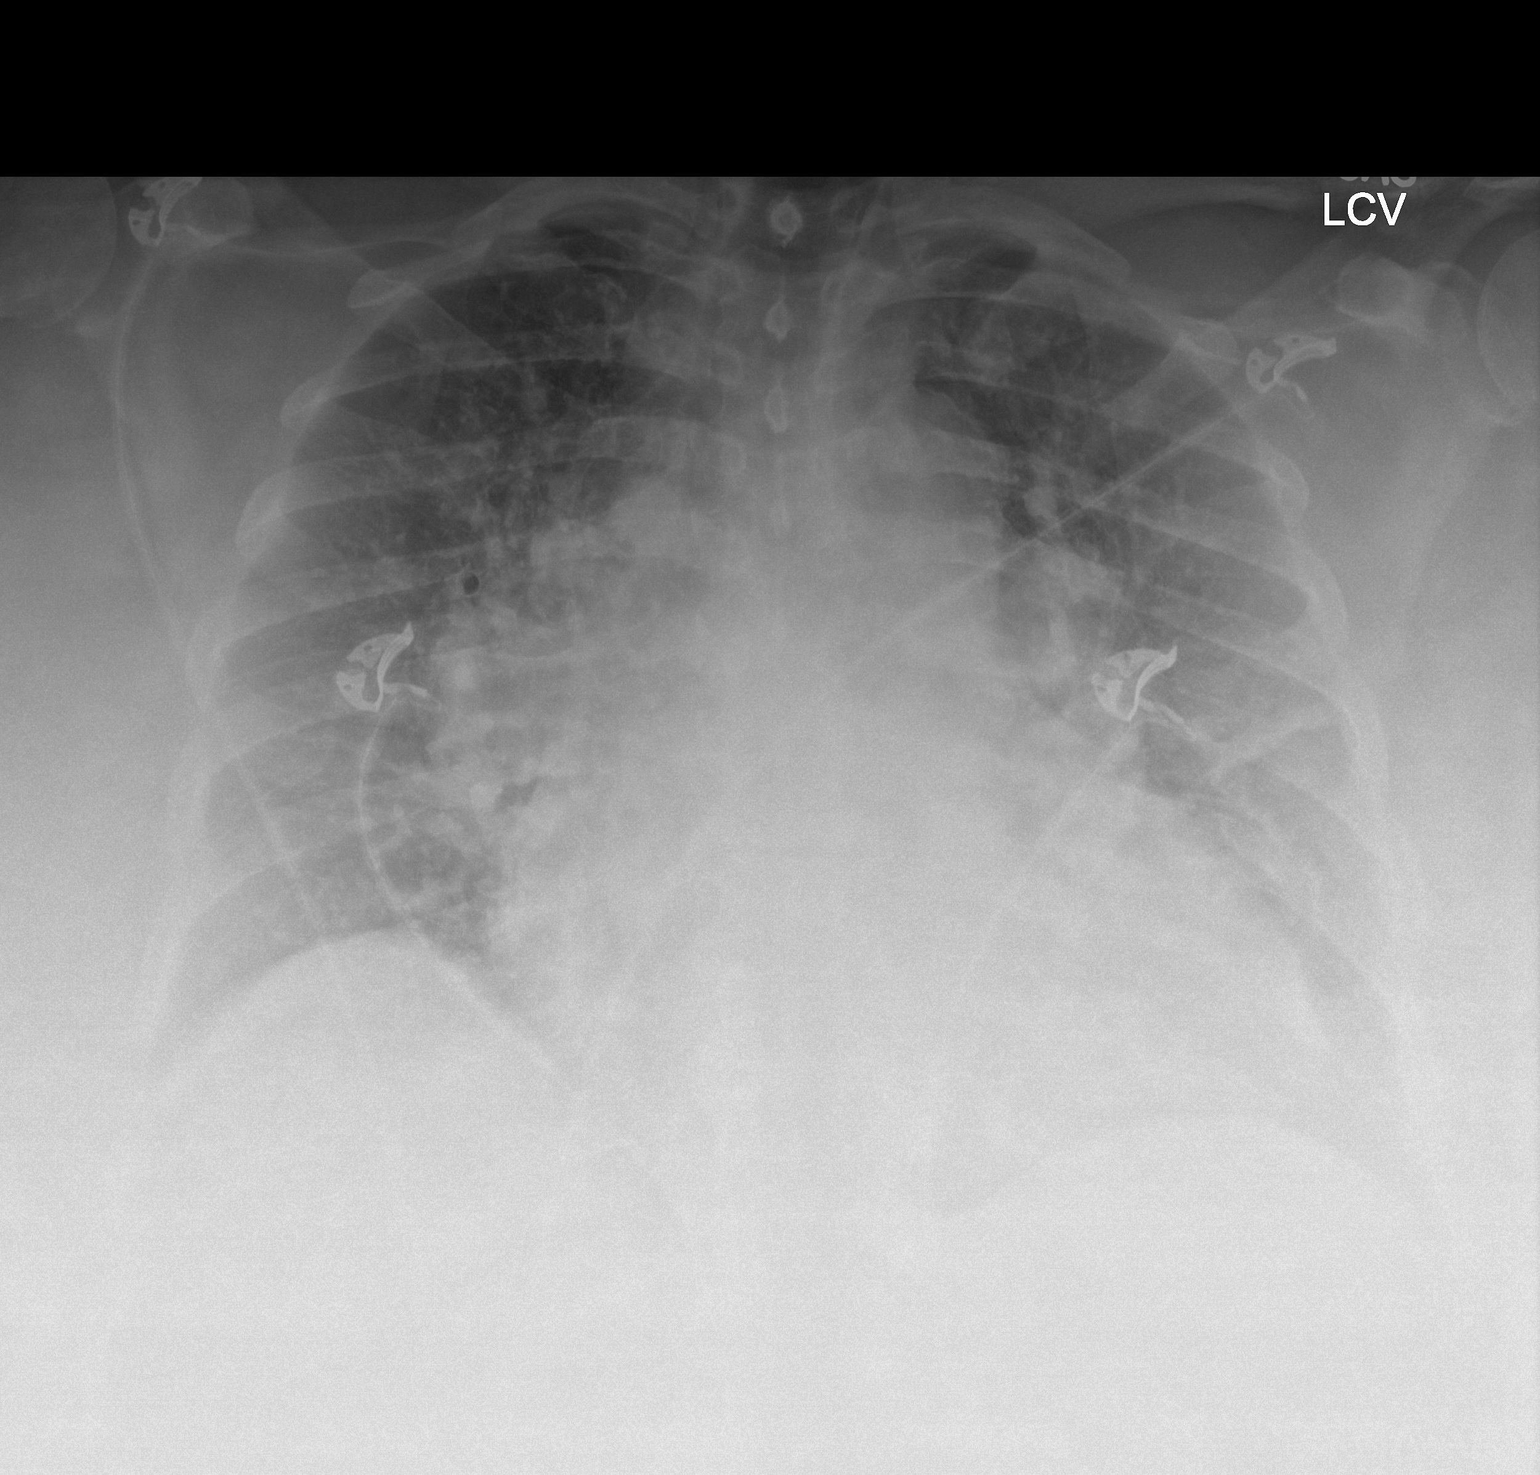

[1 of 1 positions shown; findings below may reference images not displayed]

FINDINGS: Lordotic technique is demonstrated. Lungs are adequately inflated
demonstrate hazy prominence of the perihilar markings suggesting a
degree of vascular congestion. No evidence of effusion. Minimal
linear density over the lateral left midlung likely atelectasis.
Cardiomegaly. Prominent overlying soft tissues.
IMPRESSION: Mild cardiomegaly and findings suggesting mild interstitial edema.
Minimal linear atelectasis left midlung.

## 2023-05-11 IMAGING — CT CT ABD-PELV W/O CM
2 of 4 series · 17 of 46 positions shown, 19 images · non-contrast
Comparison: Right upper quadrant ultrasound from same day.

CLINICAL DATA: Right-sided abdominal pain. Some nausea and
vomiting.

EXAM:
CT ABDOMEN AND PELVIS WITHOUT CONTRAST
TECHNIQUE: Multidetector CT imaging of the abdomen and pelvis was performed
following the standard protocol without IV contrast.

[Series 2: axial st · axial · 0.79mm/px · z∈[-548,-103]mm · 14 of 97 slices shown, 16 images]
[im 4/97  soft-tissue]
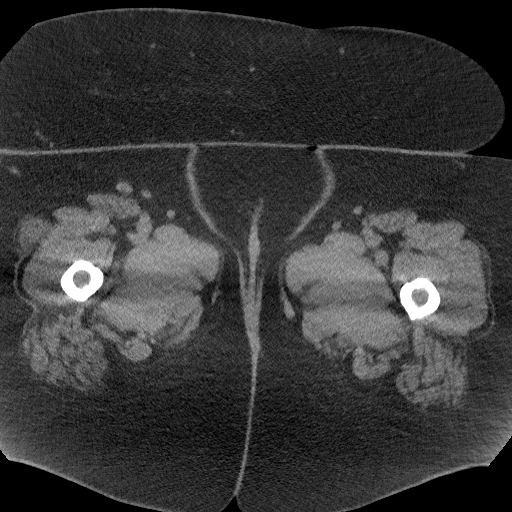
[im 4/97  bone]
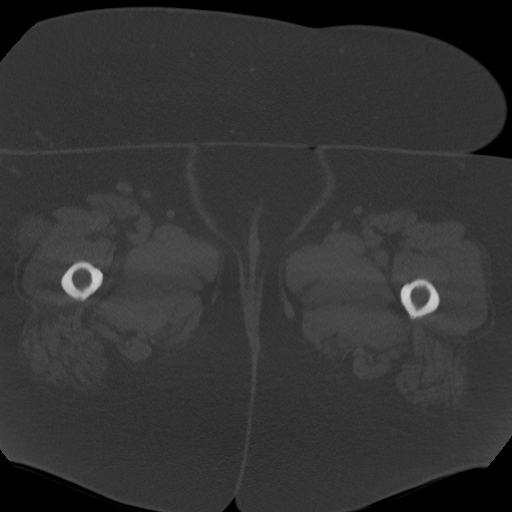
[im 12/97  soft-tissue]
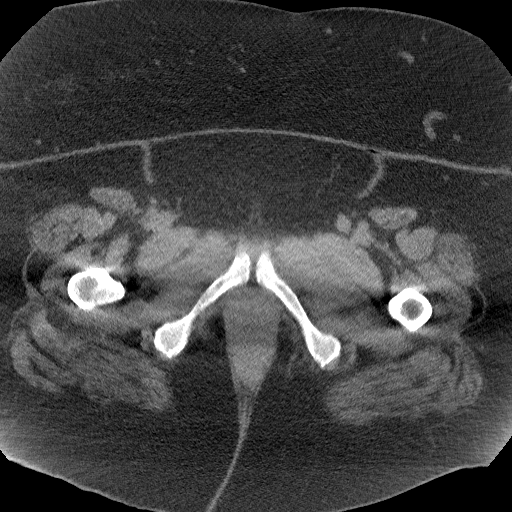
[im 20/97  soft-tissue]
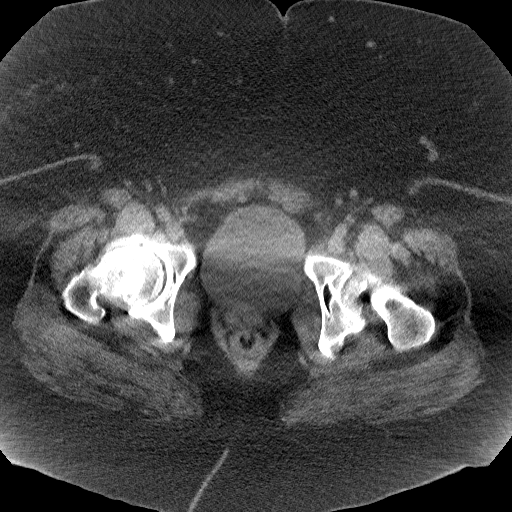
[im 27/97  soft-tissue]
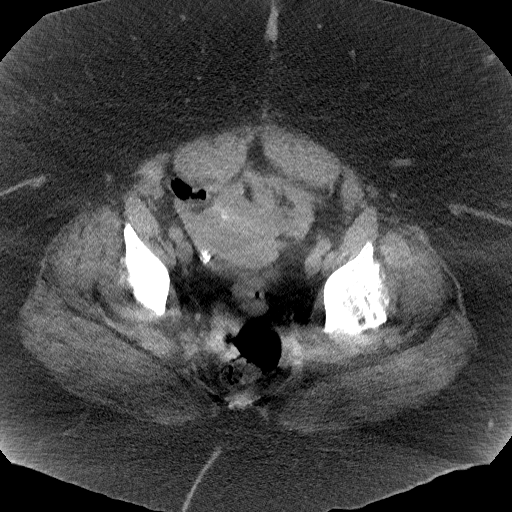
[im 31/97  soft-tissue]
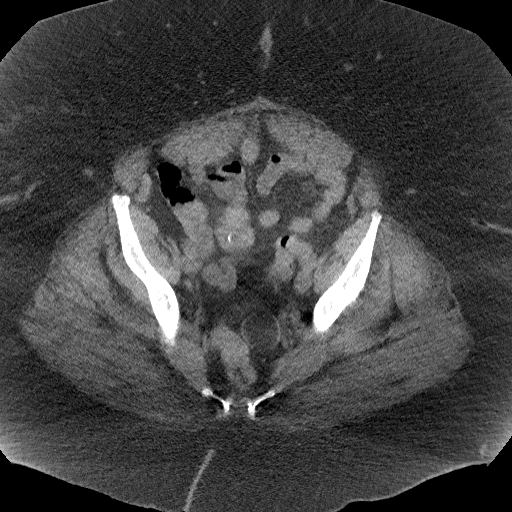
[im 39/97  soft-tissue]
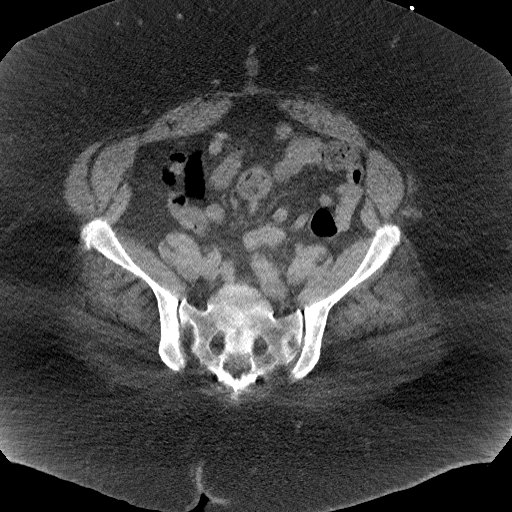
[im 47/97  soft-tissue]
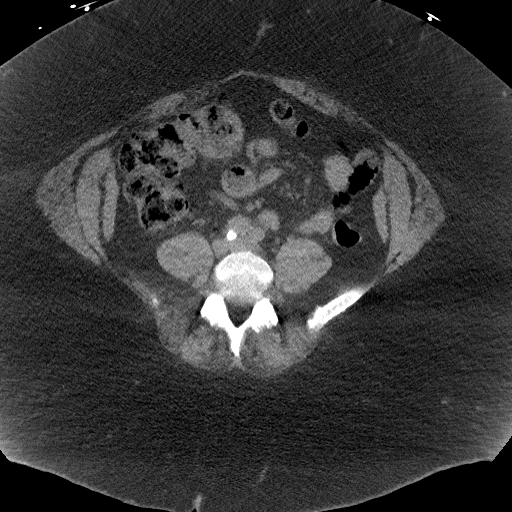
[im 50/97  soft-tissue]
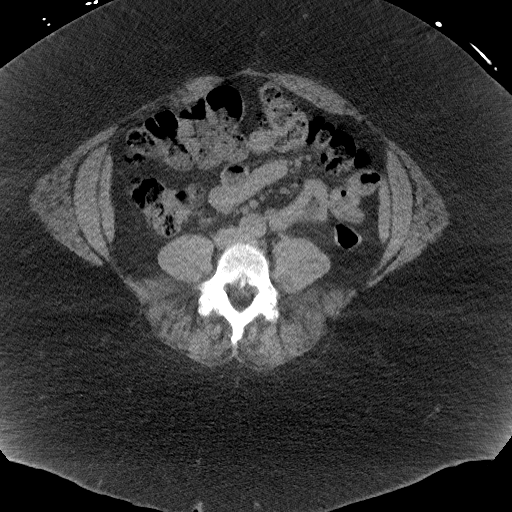
[im 58/97  soft-tissue]
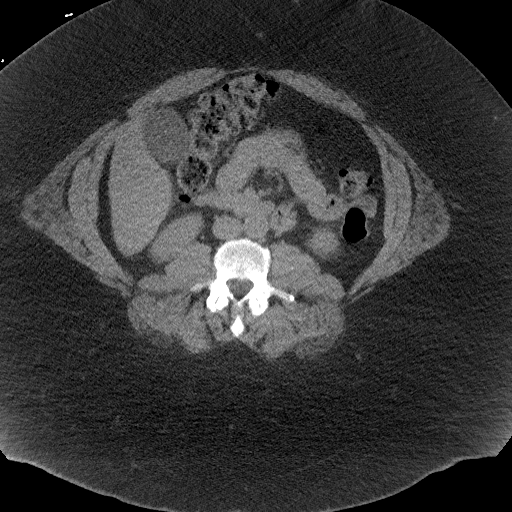
[im 58/97  bone]
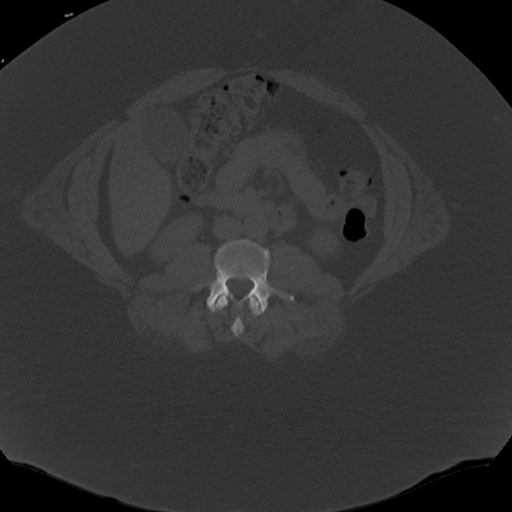
[im 66/97  soft-tissue]
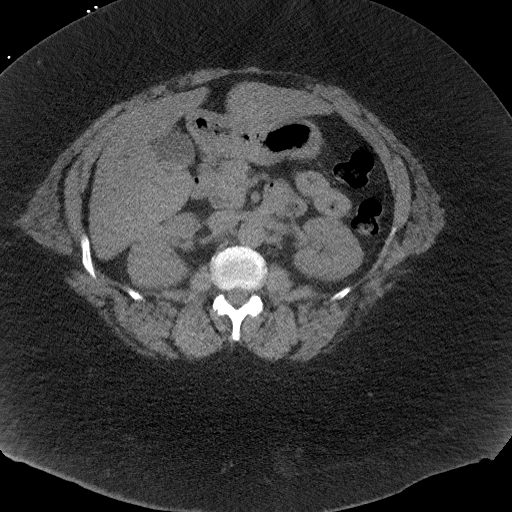
[im 73/97  soft-tissue]
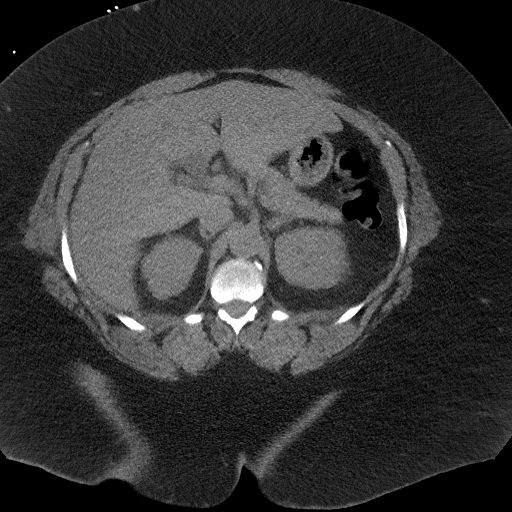
[im 77/97  soft-tissue]
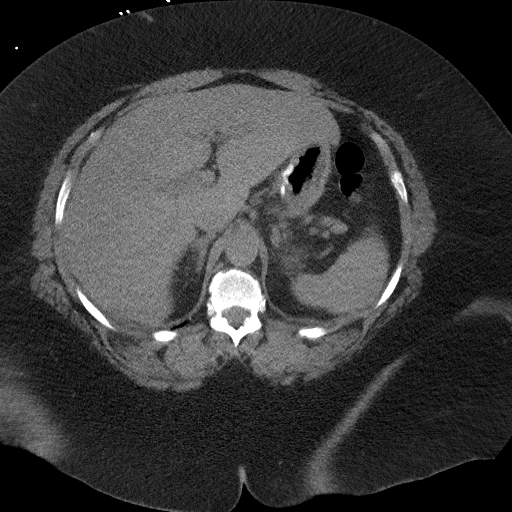
[im 85/97  soft-tissue]
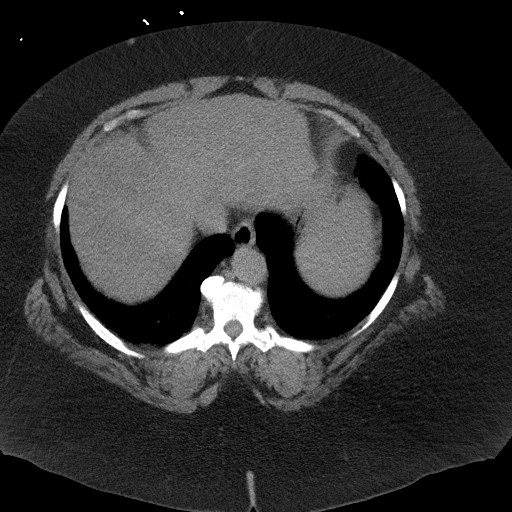
[im 93/97  soft-tissue]
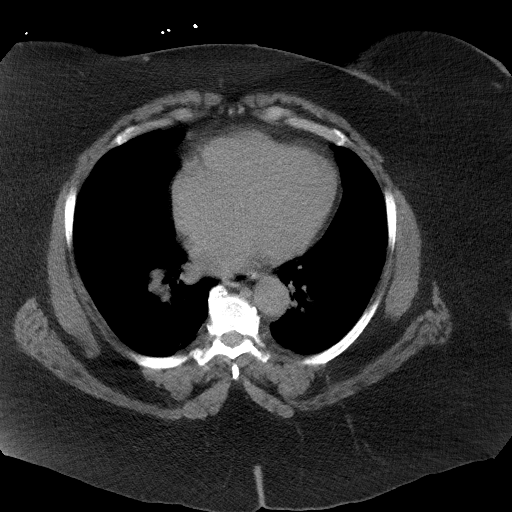

[Series 5: coronal st · coronal · 0.97mm/px · 3 of 146 slices shown]
[im 49/146  soft-tissue]
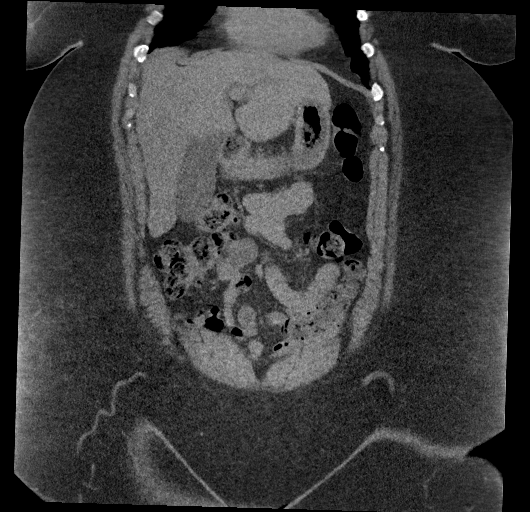
[im 65/146  soft-tissue]
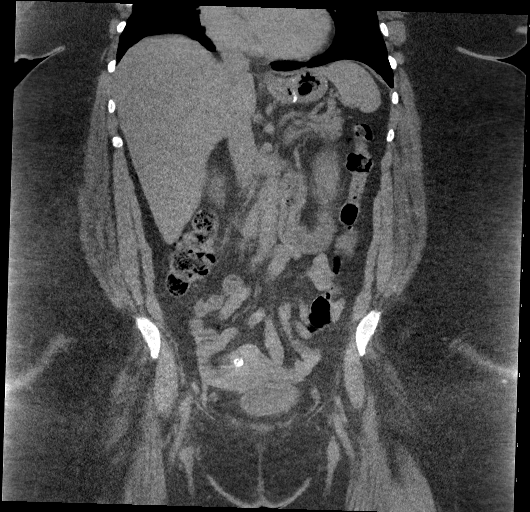
[im 81/146  soft-tissue]
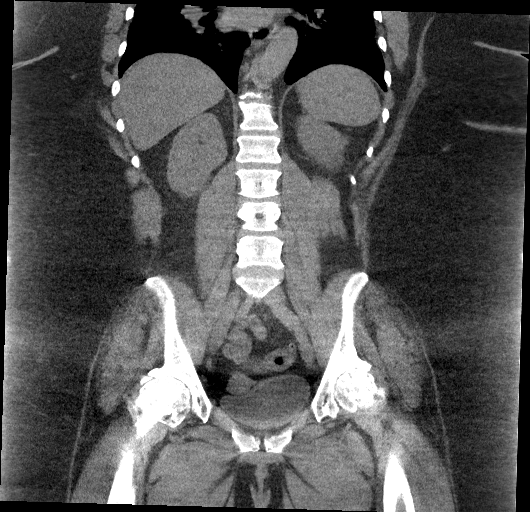

[17 of 46 positions shown; findings below may reference images not displayed]

FINDINGS: Lower chest: No acute abnormality.

Hepatobiliary: Diffusely decreased hepatic density. No focal
abnormality. Multiple tiny gallstones. No gallbladder wall
thickening or biliary dilatation.

Pancreas: Unremarkable. No pancreatic ductal dilatation or
surrounding inflammatory changes.

Spleen: Normal in size without focal abnormality.

Adrenals/Urinary Tract: Adrenal glands are unremarkable. Kidneys are
normal, without renal calculi, focal lesion, or hydronephrosis.
Bladder is unremarkable.

Stomach/Bowel: Postsurgical changes of the proximal stomach. No
bowel wall thickening, distention, or surrounding inflammatory
changes. Normal appendix.

Vascular/Lymphatic: No significant vascular findings are present. No
enlarged abdominal or pelvic lymph nodes.

Reproductive: Small calcified uterine fibroids.  No adnexal mass.

Other: Round 3.5 x 2.9 x 3.4 cm fat density lesion in the posterior
pelvis (series 2, image 67). No free fluid or pneumoperitoneum. No
abdominal wall hernia.

Musculoskeletal: No acute or significant osseous findings. Severe
lower lumbar facet arthropathy and bilateral hip osteoarthritis.
IMPRESSION: 1. No acute intra-abdominal process.
2. Hepatic steatosis.
3. Cholelithiasis.
4. Nonspecific 3.5 cm fatty mass in the posterior pelvis without
aggressive features. Follow-up CT in 6-12 months suggested to
evaluate for interval change.

## 2023-06-13 IMAGING — US US ABDOMEN LIMITED
1 series · 14 of 25 positions shown · non-contrast
Comparison: 04/18/2021

CLINICAL DATA: Right upper quadrant pain for 1 day

EXAM:
ULTRASOUND ABDOMEN LIMITED RIGHT UPPER QUADRANT

[Series 1: us abdomen limited ruq (liver/gb) · 14 of 40 slices shown]
[im 1/40]
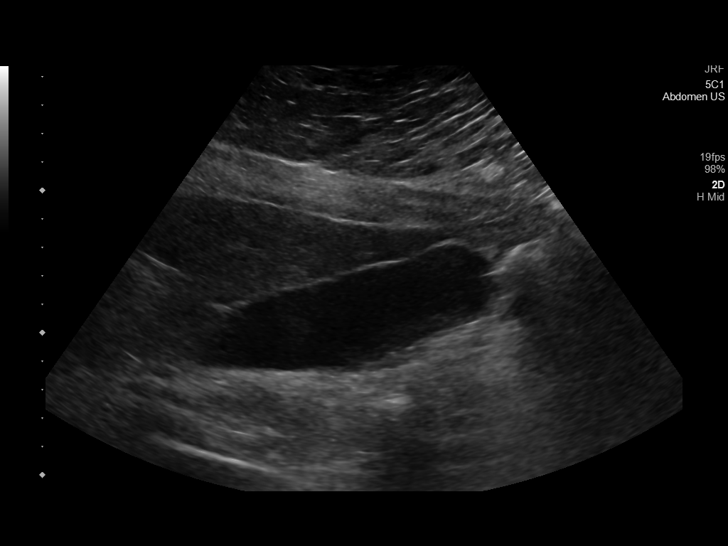
[im 4/40]
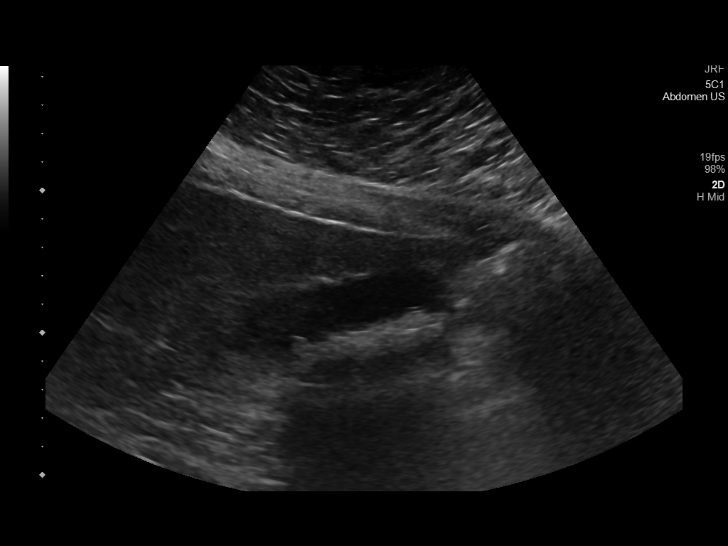
[im 7/40]
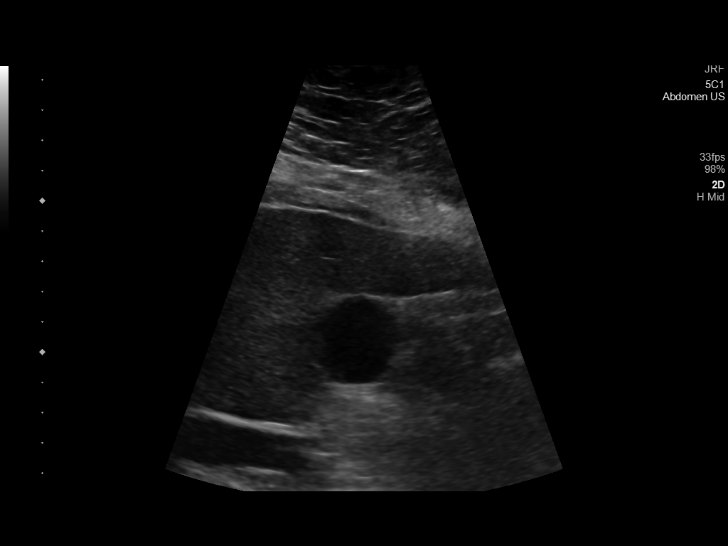
[im 10/40]
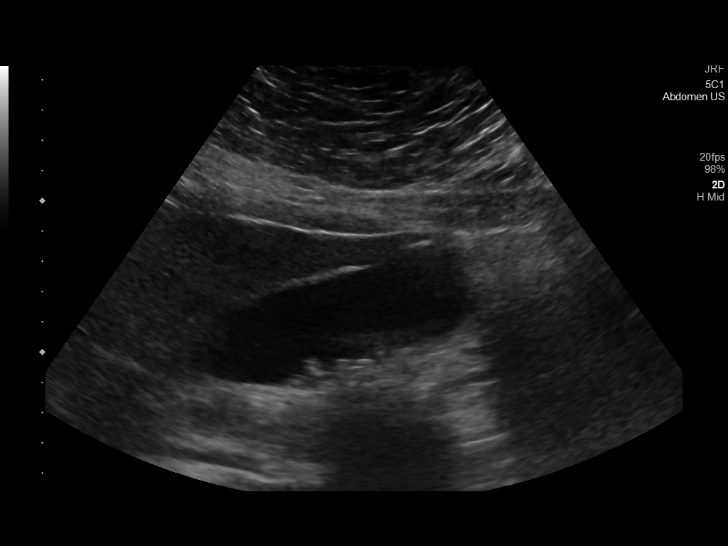
[im 14/40]
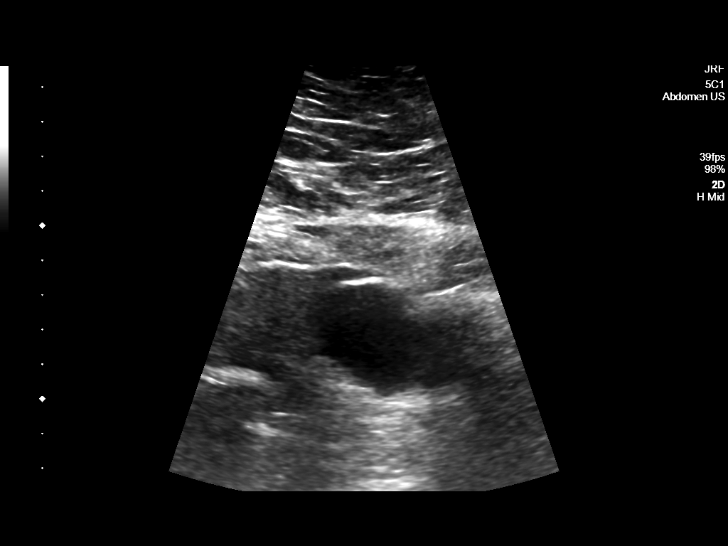
[im 15/40]
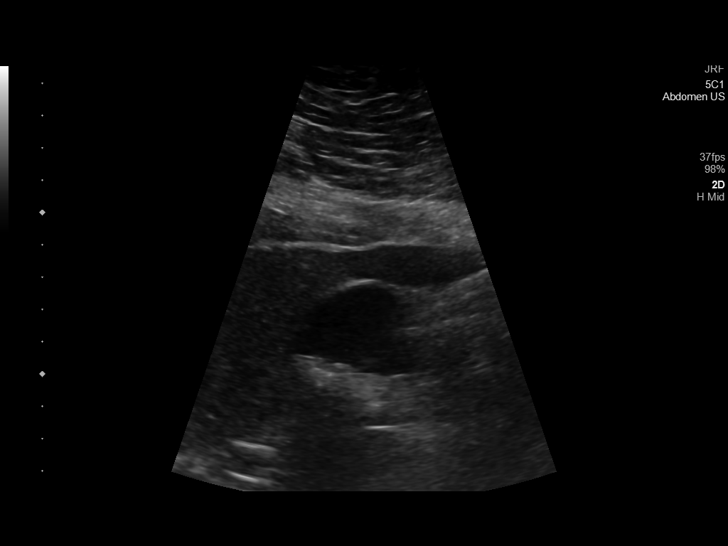
[im 18/40]
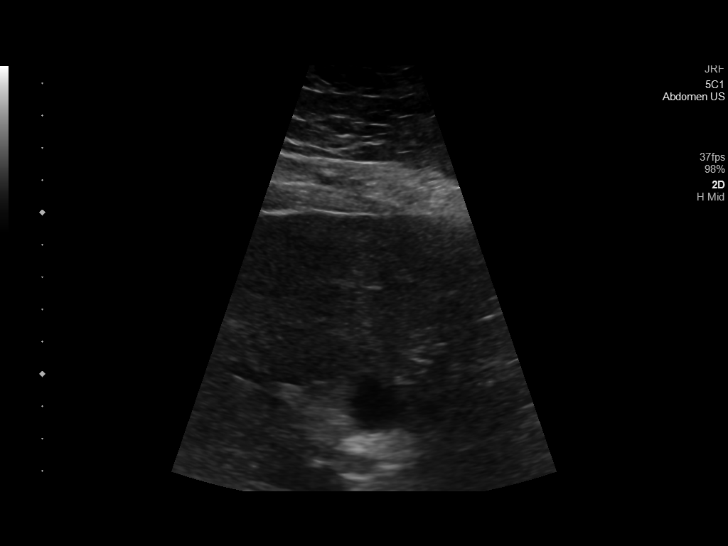
[im 22/40]
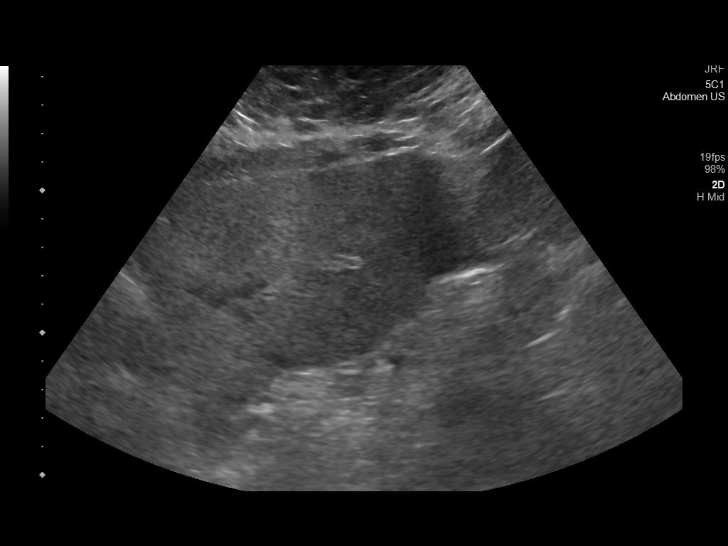
[im 25/40]
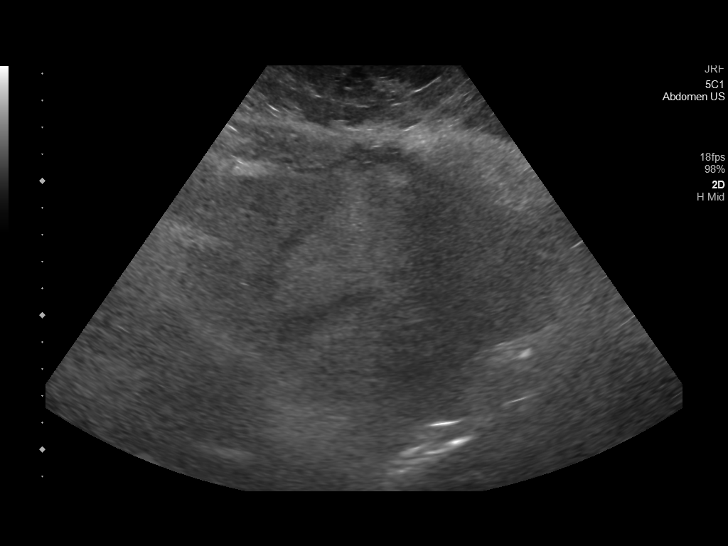
[im 27/40]
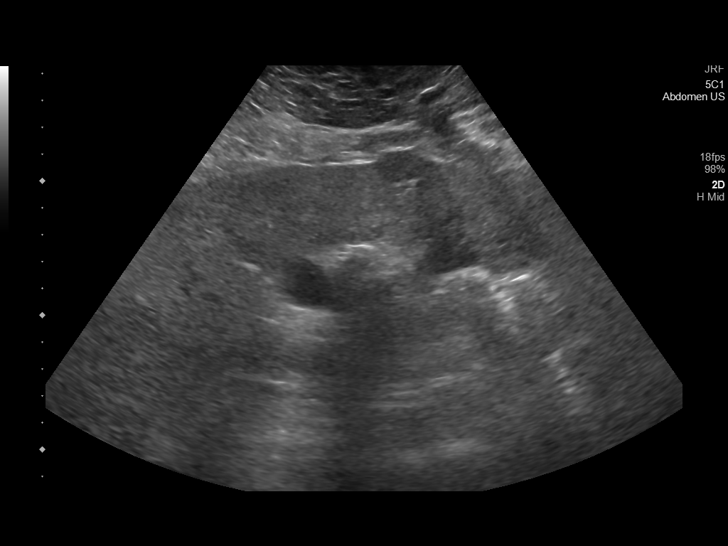
[im 30/40]
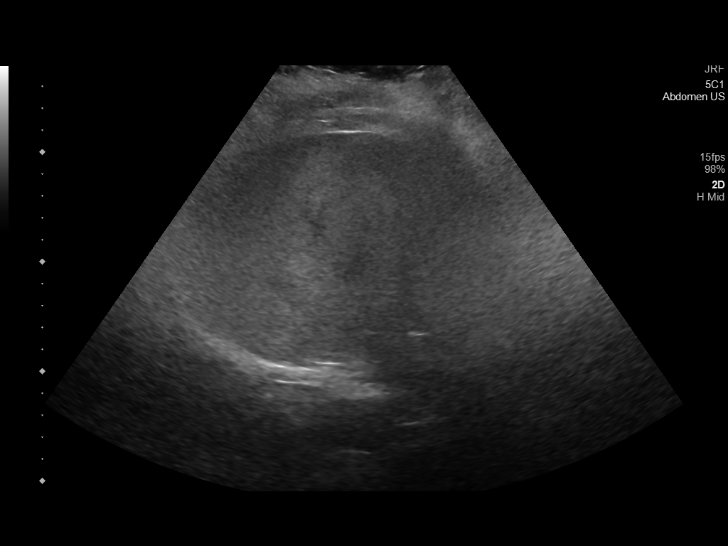
[im 33/40]
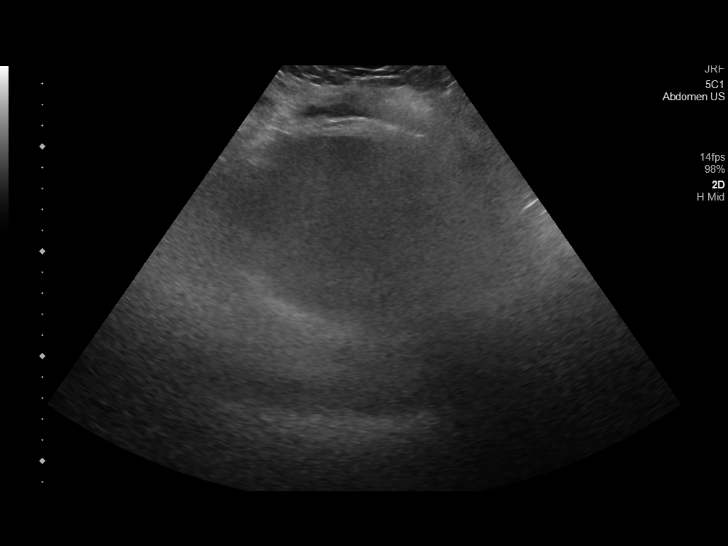
[im 36/40]
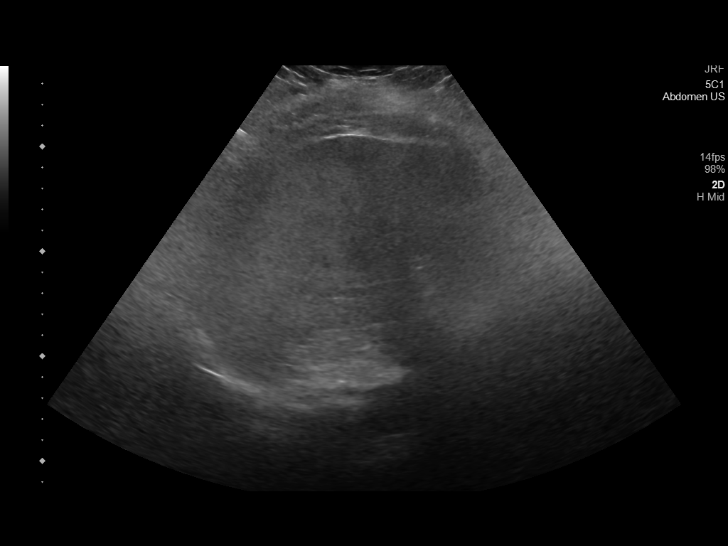
[im 40/40]
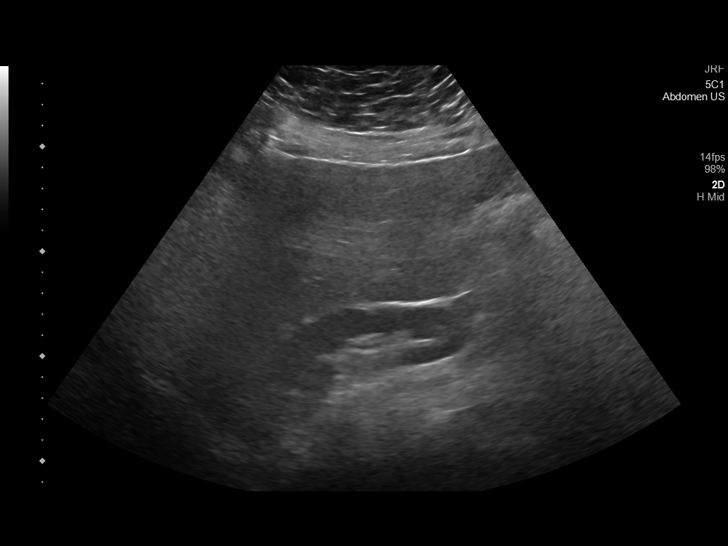

[14 of 25 positions shown; findings below may reference images not displayed]

FINDINGS: Gallbladder:

Multiple small gallstones are identified dependently within the
gallbladder, largest measuring 10 mm in size. No gallbladder wall
thickening or pericholecystic fluid. Negative Murphy sign.

Common bile duct:

Diameter: 3 mm

Liver:

Increased liver echotexture consistent with hepatic steatosis. No
focal liver abnormality. Portal vein is patent on color Doppler
imaging with normal direction of blood flow towards the liver.

Other: None.
IMPRESSION: 1. Cholelithiasis without cholecystitis.
2. Hepatic steatosis.

## 2024-02-15 DIAGNOSIS — F339 Major depressive disorder, recurrent, unspecified: Secondary | ICD-10-CM | POA: Insufficient documentation

## 2024-03-27 ENCOUNTER — Other Ambulatory Visit: Payer: Self-pay | Admitting: Specialist

## 2024-03-27 DIAGNOSIS — F17210 Nicotine dependence, cigarettes, uncomplicated: Secondary | ICD-10-CM

## 2024-04-04 ENCOUNTER — Ambulatory Visit: Admission: RE | Admit: 2024-04-04 | Source: Ambulatory Visit

## 2024-04-05 ENCOUNTER — Ambulatory Visit
Admission: RE | Admit: 2024-04-05 | Discharge: 2024-04-05 | Disposition: A | Discharge status: Home / Self Care | Source: Ambulatory Visit | Attending: Specialist | Admitting: Specialist

## 2024-04-05 DIAGNOSIS — F17210 Nicotine dependence, cigarettes, uncomplicated: Secondary | ICD-10-CM | POA: Diagnosis present

## 2024-04-28 DIAGNOSIS — I7121 Aneurysm of the ascending aorta, without rupture: Secondary | ICD-10-CM | POA: Insufficient documentation

## 2024-06-12 ENCOUNTER — Encounter: Payer: Self-pay | Admitting: Cardiology

## 2024-06-12 ENCOUNTER — Ambulatory Visit: Attending: Cardiology | Admitting: Cardiology

## 2024-06-12 VITALS — BP 128/80 | HR 94 | Ht 62.0 in | Wt 378.6 lb

## 2024-06-12 DIAGNOSIS — I77810 Thoracic aortic ectasia: Secondary | ICD-10-CM

## 2024-06-12 DIAGNOSIS — I4892 Unspecified atrial flutter: Secondary | ICD-10-CM

## 2024-06-12 DIAGNOSIS — I5033 Acute on chronic diastolic (congestive) heart failure: Secondary | ICD-10-CM

## 2024-06-12 DIAGNOSIS — I10 Essential (primary) hypertension: Secondary | ICD-10-CM

## 2024-06-12 MED ORDER — LISINOPRIL 5 MG PO TABS: 5.0000 mg | ORAL_TABLET | Freq: Every day | ORAL | 1 refills | Status: AC

## 2024-06-12 MED ORDER — DILTIAZEM HCL ER COATED BEADS 120 MG PO CP24: 120.0000 mg | ORAL_CAPSULE | Freq: Every day | ORAL | 1 refills | Status: AC

## 2024-06-12 NOTE — Progress Notes (Signed)
 Cardiology Office Note:    Date:  06/12/2024   ID:  Alexandra, Black Apr 17, 1961, MRN [id number]  PCP:  Johnie Perkins, PA-C  Cardiologist:  Redell Cave, MD  Electrophysiologist:  None   Referring MD: Johnie Perkins, PA-C   Chief Complaint  Patient presents with   Establish Care    New pt has been doing well with no complaints of chest pain, chest pressure or SOB, medciation reviewed verbally with patient    History of Present Illness:    Alexandra Black is a 63 y.o. female with a hx of atrial fibrillation/atrial flutter, morbid obesity, OSA, former smoker x30 years, who presents for follow-up.  Previously seen 4 years ago in 2021 due to A-fib/flutter.  Compliant with Xarelto  as prescribed.  Denies any bleeding issues with Xarelto .  Was previously on Cardizem , for some reason stopped taking.  Recent chest CT for lung cancer screening showed mild ascending aorta dilatation, emphysema also noted.  Endorses shortness of breath with minimal exertion, leg edema well-controlled with Lasix .  Started on Mounjaro by primary care physician, has lost about 20 pounds over the past 2 months.  Prior notes  Cardiac monitor 12/2019 A-fib noted, 2% burden Echo 2021 EF 55 to 60% patient used to live in [place] but recently moved to the area.  She was admitted to the hospital/[facility] in September/2020, diagnosed with hypoxic respiratory failure.  While hospitalized, she was noted to be in atrial flutter.  She was started on Cardizem  and Xarelto  20 mg daily.  Echocardiogram obtained during hospitalization showed normal ejection fraction with EF 55 to 60%.  Study was difficult due to morbid obesity.  Past Medical History:  Diagnosis Date   (HFpEF) heart failure with preserved ejection fraction (HCC)    a. 06/2019 Echo ([facility]): EF 55-60%; b. 04/2020 Echo: EF 55-60%, no rwma, Gr1 DD. Nl RV size/fxn. Mildly elev PASP. Triv MR.   COPD (chronic obstructive pulmonary disease) (HCC)     Depression    Hypertension    Hypothyroidism    Iron deficiency    Morbid obesity (HCC)    a. s/p bariatric surgery in 2002 (pt unsure of type of surgery).   OSA (obstructive sleep apnea)    Osteoarthritis    PAF (paroxysmal atrial fibrillation) (HCC)    a. 12/2019 Zio: Avg HR 84 (32-214). Sinus rhythm w/ brief runs of Afib (2% burden - avg 112 bpm, 47-187). Pauses up to 3.5 secs.   Paroxysmal Atrial flutter (HCC)    Pre-diabetes    Vitamin B12 deficiency     Past Surgical History:  Procedure Laterality Date   BARIATRIC SURGERY  2002   CATARACT EXTRACTION W/PHACO Right 01/08/2022   Procedure: CATARACT EXTRACTION PHACO AND INTRAOCULAR LENS PLACEMENT (IOC) RIGHT DIABETIC;  Surgeon: Jaye Fallow, MD;  Location: [facility] ORS;  Service: Ophthalmology;  Laterality: Right;    Current Medications: Current Meds  Medication Sig   allopurinol  (ZYLOPRIM ) 100 MG tablet Take 100 mg by mouth daily.   b complex vitamins tablet Take 1 tablet by mouth daily.   Biotin  1000 MCG CHEW Chew by mouth.   Cholecalciferol  25 MCG (1000 UT) capsule Take 1,000 Units by mouth daily.   Cyanocobalamin  (B-12) 5000 MCG CAPS Take by mouth daily.   ELDERBERRY PO Take by mouth daily.   furosemide  (LASIX ) 40 MG tablet Take 1 tablet (40 mg total) by mouth daily.   guaiFENesin  (MUCINEX ) 600 MG 12 hr tablet Take 600-1,200 mg by mouth 2 (two) times daily  as needed.   ipratropium-albuterol  (DUONEB) 0.5-2.5 (3) MG/3ML SOLN Take 3 mLs by nebulization every 6 (six) hours as needed.   levothyroxine  (SYNTHROID ) 150 MCG tablet Take 150 mcg by mouth daily before breakfast.   metFORMIN (GLUCOPHAGE) 500 MG tablet Take 500 mg by mouth 2 (two) times daily with a meal.   Multiple Vitamin (MULTI-VITAMIN) tablet Take 1 tablet by mouth daily.   polyethylene glycol (MIRALAX  / GLYCOLAX ) 17 g packet Take 17 g by mouth daily as needed for mild constipation.   potassium chloride  (MICRO-K ) 10 MEQ CR capsule Take 10 mEq by mouth daily.    predniSONE  (DELTASONE ) 10 MG tablet Take 4 tablets daily for 3 days  Then 3 tablets daily for 3 days  Then 2 tablets daily for 3 days then 1 tablet daily  Till done   rivaroxaban  (XARELTO ) 20 MG TABS tablet Take 20 mg by mouth daily with supper.   senna-docusate (SENOKOT-S) 8.6-50 MG tablet Take 1 tablet by mouth 2 (two) times daily.   venlafaxine  XR (EFFEXOR -XR) 150 MG 24 hr capsule Take 150 mg by mouth daily with breakfast.   vitamin E 180 MG (400 UNITS) capsule Take by mouth.   [DISCONTINUED] diltiazem  (CARDIZEM  CD) 120 MG 24 hr capsule Take 120 mg by mouth daily.   [DISCONTINUED] lisinopril  (ZESTRIL ) 10 MG tablet Take 10 mg by mouth daily.   [DISCONTINUED] spironolactone (ALDACTONE) 25 MG tablet Take 25 mg by mouth daily.     Allergies:   Patient has no known allergies.   Social History   Socioeconomic History   Marital status: Divorced    Spouse name: Not on file   Number of children: Not on file   Years of education: Not on file   Highest education level: Not on file  Occupational History   Not on file  Tobacco Use   Smoking status: Every Day    Current packs/day: 1.00    Types: Cigarettes   Smokeless tobacco: Never   Tobacco comments:    Currently smoking 1 ppd.  Quit for 20 yrs between ages of ~ [age].  Substance and Sexual Activity   Alcohol use: Yes    Alcohol/week: 2.0 standard drinks of alcohol    Types: 2 Glasses of wine per week   Drug use: Yes    Types: Marijuana    Comment: 2 puffs on a joint daily.   Sexual activity: Not Currently  Other Topics Concern   Not on file  Social History Narrative   Lives locally with two cats and a dog.  Family nearby.  Previously lived in [place].  Disabled x 13 yrs.  Has MBA in [employer] but says she never had an opportunity to use it.  Ambulates w/ walker.   Social Drivers of Corporate investment banker Strain: Low Risk  (06/01/2024)   Received from [facility]   Overall Financial Resource Strain  (CARDIA)    Difficulty of Paying Living Expenses: Not very hard  Recent Concern: Financial Resource Strain - Medium Risk (03/22/2024)   Received from [facility]   Overall Financial Resource Strain (CARDIA)    Difficulty of Paying Living Expenses: Somewhat hard  Food Insecurity: No Food Insecurity (06/01/2024)   Received from [facility]   Hunger Vital Sign    Within the past 12 months, you worried that your food would run out before you got the money to buy more.: Never true    Within the past 12  months, the food you bought just didn't last and you didn't have money to get more.: Never true  Recent Concern: Food Insecurity - Food Insecurity Present (03/22/2024)   Received from [facility]   Hunger Vital Sign    Within the past 12 months, you worried that your food would run out before you got the money to buy more.: Sometimes true    Within the past 12 months, the food you bought just didn't last and you didn't have money to get more.: Sometimes true  Transportation Needs: No Transportation Needs (06/01/2024)   Received from [facility] - Transportation    In the past 12 months, has lack of transportation kept you from medical appointments or from getting medications?: No    Lack of Transportation (Non-Medical): No  Physical Activity: Not on file  Stress: Not on file  Social Connections: Not on file     Family History: The patient's family history includes Depression in her sister; Heart disease in her mother; Hypertension in her mother; Obesity in her brother, sister, sister, and sister; Peripheral Artery Disease in her father.  ROS:   Please see the history of present illness.     All other systems reviewed and are negative.  EKGs/Labs/Other Studies Reviewed:    The following studies were reviewed today:  EKG Interpretation Date/Time:  Monday June 12 2024 14:19:18 EDT Ventricular Rate:  94 PR  Interval:  118 QRS Duration:  90 QT Interval:  326 QTC Calculation: 407 R Axis:   -16  Text Interpretation: Normal sinus rhythm Possible Left atrial enlargement Anterolateral infarct (cited on or before 21-May-2021) Confirmed by Darliss Rogue ([id number]) on 06/12/2024 2:45:49 PM    Recent Labs: No results found for requested labs within last 365 days.  Recent Lipid Panel No results found for: CHOL, TRIG, HDL, CHOLHDL, VLDL, LDLCALC, LDLDIRECT  Physical Exam:    VS:  BP 128/80 (BP Location: Left Arm, Patient Position: Sitting, Cuff Size: Normal)   Pulse 94   Ht 5' 2 (1.575 m)   Wt (!) 378 lb 9.6 oz (171.7 kg)   BMI 69.25 kg/m     Wt Readings from Last 3 Encounters:  06/12/24 (!) 378 lb 9.6 oz (171.7 kg)  01/08/22 (!) 413 lb (187.3 kg)  05/21/21 (!) 394 lb (178.7 kg)     GEN:  Well nourished, well developed in no acute distress, obese HEENT: Normal NECK: No JVD; No carotid bruits CARDIAC: RRR, no murmurs, rubs, gallops RESPIRATORY:  Clear to auscultation without rales, wheezing or rhonchi  ABDOMEN: Soft, non-tender, distended MUSCULOSKELETAL:  2+ edema; No deformity  SKIN: Warm and dry NEUROLOGIC:  Alert and oriented x 3 PSYCHIATRIC:  Normal affect   ASSESSMENT:    1. Atrial flutter, unspecified type (HCC)   2. Acute on chronic diastolic heart failure (HCC)   3. Ascending aorta dilation (HCC)   4. Morbid obesity (HCC)   5. Primary hypertension    PLAN:    In order of problems listed above:  Patient with history of A. Fib/flutter.  2-week cardiac monitor showed paroxysmal atrial fibrillation, 2% burden.  Echocardiogram 04/2020 shows normal ejection fraction with EF 55 to 60%, impaired relaxation..  CHA2DS2-VASc score 2 (htn, gender).  Restart Cardizem  CD 120 mg daily, continue Xarelto  20 mg daily.  Obtain echo. HFpEF, leg edema, shortness of breath largely driven by morbid obesity, emphysema.  Continue Lasix  40 mg daily.  Edema well-controlled. Mild  ascending aorta dilatation  measuring 4.2 cm.  Continue serial monitoring with lung cancer screening chest CT. Morbid obesity, low-calorie diet, weight loss advised.  Congratulated on losing 20 pounds over the past 2 months.  Continue Mounjaro, advised to increase activity levels. Hypertension, BP controlled.  Reduce lisinopril  to 5 mg daily with restarting Cardizem .  Follow-up in 3 months   This note was generated in part or whole with voice recognition software. Voice recognition is usually quite accurate but there are transcription errors that can and very often do occur. I apologize for any typographical errors that were not detected and corrected.  Medication Adjustments/Labs and Tests Ordered: Current medicines are reviewed at length with the patient today.  Concerns regarding medicines are outlined above.  Orders Placed This Encounter  Procedures   EKG 12-Lead   ECHOCARDIOGRAM COMPLETE   Meds ordered this encounter  Medications   lisinopril  (ZESTRIL ) 5 MG tablet    Sig: Take 1 tablet (5 mg total) by mouth daily.    Dispense:  90 tablet    Refill:  1   diltiazem  (CARDIZEM  CD) 120 MG 24 hr capsule    Sig: Take 1 capsule (120 mg total) by mouth daily.    Dispense:  90 capsule    Refill:  1    Patient Instructions  Medication Instructions:  RESTART the Diltiazem  120 mg once daily  DECREASE the Lisinopril  5 mg once daily  *If you need a refill on your cardiac medications before your next appointment, please call your pharmacy*  Lab Work: None ordered If you have labs (blood work) drawn today and your tests are completely normal, you will receive your results only by: MyChart Message (if you have MyChart) OR A paper copy in the mail If you have any lab test that is abnormal or we need to change your treatment, we will call you to review the results.  Testing/Procedures: Your physician has requested that you have an echocardiogram. Echocardiography is a painless test that  uses sound waves to create images of your heart. It provides your doctor with information about the size and shape of your heart and how well your heart's chambers and valves are working.   You may receive an ultrasound enhancing agent through an IV if needed to better visualize your heart during the echo. This procedure takes approximately one hour.  There are no restrictions for this procedure.  This will take place at [address] [facility] [address], [place] [address]  Please note: We ask at that you not bring children with you during ultrasound (echo/ vascular) testing. Due to room size and safety concerns, children are not allowed in the ultrasound rooms during exams. Our front office staff cannot provide observation of children in our lobby area while testing is being conducted. An adult accompanying a patient to their appointment will only be allowed in the ultrasound room at the discretion of the ultrasound technician under special circumstances. We apologize for any inconvenience.   Follow-Up: At [facility], you and your health needs are our priority.  As part of our continuing mission to provide you with exceptional heart care, our providers are all part of one team.  This team includes your primary Cardiologist (physician) and Advanced Practice Providers or APPs (Physician Assistants and Nurse Practitioners) who all work together to provide you with the care you need, when you need it.  Your next appointment:   3 month(s)  Provider:    Lonni Meager, NP Lesley Maffucci, PA-C Ryan  Dunn, PA-C Cadence Furth, PA-C Tylene Lunch, NP Barnie Hila, NP    We recommend signing up for the patient portal called MyChart.  Sign up information is provided on this After Visit Summary.  MyChart is used to connect with patients for Virtual Visits (Telemedicine).  Patients are able to view lab/test results, encounter notes, upcoming appointments, etc.  Non-urgent  messages can be sent to your provider as well.   To learn more about what you can do with MyChart, go to [email or web address].  =   Signed, Redell Cave, MD  06/12/2024 4:00 PM    [facility]

## 2024-06-12 NOTE — Patient Instructions (Signed)
 Medication Instructions:  RESTART the Diltiazem  120 mg once daily  DECREASE the Lisinopril  5 mg once daily  *If you need a refill on your cardiac medications before your next appointment, please call your pharmacy*  Lab Work: None ordered If you have labs (blood work) drawn today and your tests are completely normal, you will receive your results only by: MyChart Message (if you have MyChart) OR A paper copy in the mail If you have any lab test that is abnormal or we need to change your treatment, we will call you to review the results.  Testing/Procedures: Your physician has requested that you have an echocardiogram. Echocardiography is a painless test that uses sound waves to create images of your heart. It provides your doctor with information about the size and shape of your heart and how well your heart's chambers and valves are working.   You may receive an ultrasound enhancing agent through an IV if needed to better visualize your heart during the echo. This procedure takes approximately one hour.  There are no restrictions for this procedure.  This will take place at [address] [facility] [address], [place] [address]  Please note: We ask at that you not bring children with you during ultrasound (echo/ vascular) testing. Due to room size and safety concerns, children are not allowed in the ultrasound rooms during exams. Our front office staff cannot provide observation of children in our lobby area while testing is being conducted. An adult accompanying a patient to their appointment will only be allowed in the ultrasound room at the discretion of the ultrasound technician under special circumstances. We apologize for any inconvenience.   Follow-Up: At [facility], you and your health needs are our priority.  As part of our continuing mission to provide you with exceptional heart care, our providers are all part of one team.  This team includes your primary  Cardiologist (physician) and Advanced Practice Providers or APPs (Physician Assistants and Nurse Practitioners) who all work together to provide you with the care you need, when you need it.  Your next appointment:   3 month(s)  Provider:    Lonni Meager, NP Lesley Maffucci, PA-C Bernardino Bring, PA-C Cadence Franchester, PA-C Tylene Lunch, NP Barnie Hila, NP    We recommend signing up for the patient portal called MyChart.  Sign up information is provided on this After Visit Summary.  MyChart is used to connect with patients for Virtual Visits (Telemedicine).  Patients are able to view lab/test results, encounter notes, upcoming appointments, etc.  Non-urgent messages can be sent to your provider as well.   To learn more about what you can do with MyChart, go to [email or web address].  =

## 2024-07-05 ENCOUNTER — Other Ambulatory Visit: Payer: Self-pay | Admitting: Sports Medicine

## 2024-07-05 DIAGNOSIS — M25552 Pain in left hip: Secondary | ICD-10-CM

## 2024-07-05 DIAGNOSIS — M1612 Unilateral primary osteoarthritis, left hip: Secondary | ICD-10-CM

## 2024-07-06 ENCOUNTER — Other Ambulatory Visit

## 2024-07-07 ENCOUNTER — Ambulatory Visit
Admission: RE | Admit: 2024-07-07 | Discharge: 2024-07-07 | Disposition: A | Discharge status: Home / Self Care | Source: Ambulatory Visit | Attending: Sports Medicine | Admitting: Sports Medicine

## 2024-07-07 DIAGNOSIS — M25552 Pain in left hip: Secondary | ICD-10-CM | POA: Insufficient documentation

## 2024-07-07 DIAGNOSIS — M1612 Unilateral primary osteoarthritis, left hip: Secondary | ICD-10-CM | POA: Insufficient documentation

## 2024-07-24 ENCOUNTER — Ambulatory Visit: Attending: Cardiology

## 2024-09-03 NOTE — Progress Notes (Signed)
 PROVIDER NOTE: Interpretation of information contained herein should be left to medically-trained personnel. Specific patient instructions are provided elsewhere under Patient Instructions section of medical record. This document was created in part using AI and STT-dictation technology, any transcriptional errors that may result from this process are unintentional.  Patient: Alexandra Black  Service: E/M Encounter  Provider: Eric DELENA Como, MD  DOB: 1961/09/25  Delivery: Face-to-face  Specialty: Interventional Pain Management  MRN: [id number]  Setting: Ambulatory outpatient facility  Specialty designation: 09  Type: New Patient  Location: Outpatient office facility  PCP: Alexandra Black, Alexandra Black  DOS: 09/11/2024    Referring Prov.: Alexandra Prentice PARAS, Black   Primary Reason(s) for Visit: Encounter for initial evaluation of one or more chronic problems (new to examiner) potentially causing chronic pain, and posing a threat to normal musculoskeletal function. (Level of risk: High) CC: Hip Pain (bilateral)  HPI  Alexandra Black is a 63 y.o. year old, female patient, who comes for the first time to our practice referred by Alexandra Prentice PARAS, Black for our initial evaluation of her chronic pain. She has Acute respiratory failure with hypoxia (HCC); Obstructive sleep apnea syndrome; Atrial flutter (HCC); Depression; Hypothyroidism; COPD (chronic obstructive pulmonary disease) (HCC); GERD (gastroesophageal reflux disease); Type 2 diabetes mellitus without complication, without long-term current use of insulin  (HCC); Acute on chronic diastolic heart failure (HCC); Chronic diastolic CHF (congestive heart failure) (HCC); Paroxysmal atrial fibrillation (HCC); Severe tobacco use disorder; Mass of pelvis; Abdominal pain; Choledocholithiasis; Cholangitis (HCC); Abnormal LFTs; Acute blood loss anemia; Aneurysm of ascending aorta without rupture; Anxiety disorder, unspecified; Chronic headaches; Combined forms of  age-related cataract of both eyes; Current mild episode of major depressive disorder without prior episode; Dry eye; GI bleed; Hypertension; Chronic knee pain (Bilateral); Body mass index (BMI) 70 or greater, adult (HCC); Prediabetes; Recurrent major depressive disorder; Senile nuclear cataract, left; Chronic hip pain (Bilateral); Transaminitis; Arthritis; Cholelithiases; Gout; Type 2 diabetes mellitus treated without insulin  (HCC); Chronic anticoagulation (Xarelto ); Osteoarthritis involving multiple joints; Severe, end-stage osteoarthritis of hips (Bilateral); Osteoarthritis of knees (Bilateral); Chronic pain syndrome; Pharmacologic therapy; Disorder of skeletal system; and Problems influencing health status on their problem list. Today she comes in for evaluation of her Hip Pain (bilateral)  Pain Assessment: Location: Right, Left Hip Radiating: denies Onset: More than a month ago Duration: Chronic pain Quality: Sharp Severity: 10-Worst pain ever/10 (subjective, self-reported pain score)  Effect on ADL: difficulty performing daily activities Timing: Intermittent Modifying factors: nothing BP: 122/81  HR: 99  Onset and Duration: Present longer than 3 months Cause of pain: Unknown Severity: Getting worse and NAS-11 on the average: 7/10 Timing: Not influenced by the time of the day and During activity or exercise Aggravating Factors: Bending, Climbing, Kneeling, Lifiting, Motion, Prolonged sitting, Prolonged standing, Walking, Walking uphill, Walking downhill, and Working Alleviating Factors: Stretching, Lying down, Medications, TENS, and Chiropractic manipulations Associated Problems: Night-time cramps, Depression, Dizziness, Fatigue, Numbness, Sadness, Weakness, Pain that wakes patient up, and Pain that does not allow patient to sleep Quality of Pain: Aching, Intermittent, Disabling, Sharp, Shooting, and Tiring Previous Examinations or Tests: X-rays, Orthopedic evaluation, and Chiropractic  evaluation Previous Treatments: Chiropractic manipulations, Narcotic medications, Physical Therapy, Pool exercises, Stretching exercises, and TENS  Alexandra Black is being evaluated for possible interventional pain management therapies for the treatment of her chronic pain.  Discussed the use of AI scribe software for clinical note transcription with the patient, who gave verbal consent to proceed.  History of Present Illness   Alexandra Benedict  Black is a 63 year old female who presents for evaluation of possible bilateral hip injections. She was referred by Alexandra Black, for possible evaluation on bilateral hip injections.  She has chronic bilateral hip and knee pain, described as 'bone to bone,' persisting for over ten years, with worsening pain on movement and weight-bearing, especially in the left hip and knee. She uses a walker since 2011 due to mobility concerns. She previously received bilateral knee injections with bupivacaine and betamethasone under ultrasound guidance on June 23, 2024.  She is morbidly obese with a BMI over 65, currently weighing 378 pounds, and has lost 50 pounds. She is on Mujavec for weight loss, having previously tried Ozempic without success. She aims to lose an additional 103 pounds to reach her target weight of 250 pounds.  She experiences left shoulder pain, which sometimes prevents her from lifting her purse or putting weight on her arm to get out of the car. She has an upcoming appointment for her shoulder.  Her medical history includes obstructive sleep apnea, diastolic congestive heart failure, hypertension, ascending aortic aneurysm, atrial flutter, atrial fibrillation, COPD, gastroesophageal reflux disease, anxiety, depression, and a history of gestational diabetes and prediabetes. She is on Xarelto  for anticoagulation due to atrial fibrillation and takes metformin for diabetes management. She experiences episodes of feeling out of breath, dizziness,  and palpitations, which occur frequently without any specific trigger and usually resolve on her own. She is scheduled for a cardiac test next month to evaluate her heart condition further.  She has a history of smoking and is currently trying to quit, attending a mindfulness class for support. 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        Ms. Hillmann has been informed that this initial visit was an evaluation only.  On the follow up appointment I will go over the results, including ordered tests and available interventional therapies. At that time she will have the opportunity to decide whether to proceed with offered therapies or not. In the event that Ms. Hettinger prefers avoiding interventional options, this will conclude our involvement in the case.  Medication management recommendations may be provided upon request.  Patient informed that diagnostic tests may be ordered to assist in identifying underlying causes, narrow the list of differential diagnoses and aid in determining candidacy for (or contraindications to) planned therapeutic interventions.  Historic Controlled Substance Pharmacotherapy Review PMP and historical list of controlled substances: Oxycodone IR 5 mg tablet 1-2 tab p.o. 5 times per day (# 10) (last filled on 03/27/2023) Most recently prescribed controlled substance(s): Opioid Analgesic: None MME/day: 0 mg/day  Historical Monitoring: The patient  reports current drug use. Drug: Marijuana. List of prior UDS Testing: No results found for: MDMA, COCAINSCRNUR, PCPSCRNUR, PCPQUANT, CANNABQUANT, THCU, ETH, CBDTHCR, D8THCCBX, D9THCCBX Historical Background Evaluation: [place] PMP: PDMP reviewed during this encounter. Review of the past 59-months conducted.             PMP NARX Score Report:  Narcotic: 030 Sedative: 010 Stimulant: 000  Department of public safety, offender search: Engineer, [employer]  Information) Non-contributory Risk Assessment Profile: Aberrant behavior: None observed or detected today Risk factors for fatal opioid overdose: None identified today PMP NARX Overdose Risk Score: 160 Fatal overdose hazard ratio (HR): Calculation deferred Non-fatal overdose hazard ratio (HR): Calculation deferred Risk of opioid abuse or dependence: 0.7-3.0% with doses <= 36 MME/day and 6.1-26% with doses >= 120 MME/day. Substance use disorder (SUD)  risk level: See below Personal History of Substance Abuse (SUD-Substance use disorder):  Alcohol: Negative  Illegal Drugs: Negative  Rx Drugs: Negative  ORT Risk Level calculation: High Risk  Opioid Risk Tool - 09/11/24 1310       Family History of Substance Abuse   Alcohol Positive Female    Illegal Drugs Positive Female    Rx Drugs Positive Female or Female      Personal History of Substance Abuse   Alcohol Negative    Illegal Drugs Negative    Rx Drugs Negative      Age   Age between 85-45 years  No      History of Preadolescent Sexual Abuse   History of Preadolescent Sexual Abuse Negative or Female      Psychological Disease   Psychological Disease Negative    Depression Positive      Total Score   Opioid Risk Tool Scoring 8    Opioid Risk Interpretation High Risk         ORT Scoring interpretation table:  Score <3 = Low Risk for SUD  Score between 4-7 = Moderate Risk for SUD  Score >8 = High Risk for Opioid Abuse   PHQ-2 Depression Scale:  Total score: 0  PHQ-2 Scoring interpretation table: (Score and probability of major depressive disorder)  Score 0 = No depression  Score 1 = 15.4% Probability  Score 2 = 21.1% Probability  Score 3 = 38.4% Probability  Score 4 = 45.5% Probability  Score 5 = 56.4% Probability  Score 6 = 78.6% Probability   PHQ-9 Depression Scale:  Total score: 0  PHQ-9 Scoring interpretation table:  Score 0-4 = No depression  Score 5-9 = Mild depression  Score 10-14 = Moderate depression   Score 15-19 = Moderately severe depression  Score 20-27 = Severe depression (2.4 times higher risk of SUD and 2.89 times higher risk of overuse)   Pharmacologic Plan: As per protocol, I have not taken over any controlled substance management, pending the results of ordered tests and/or consults.            Initial impression: Pending review of available data and ordered tests.  Meds   Current Outpatient Medications:    b complex vitamins tablet, Take 1 tablet by mouth daily., Disp: , Rfl:    Biotin  1000 MCG CHEW, Chew by mouth., Disp: , Rfl:    Cholecalciferol  25 MCG (1000 UT) capsule, Take 1,000 Units by mouth daily., Disp: , Rfl:    Cyanocobalamin  (B-12) 5000 MCG CAPS, Take by mouth daily., Disp: , Rfl:    diltiazem  (CARDIZEM  CD) 120 MG 24 hr capsule, Take 1 capsule (120 mg total) by mouth daily., Disp: 90 capsule, Rfl: 1   ELDERBERRY PO, Take by mouth daily., Disp: , Rfl:    furosemide  (LASIX ) 40 MG tablet, Take 1 tablet (40 mg total) by mouth daily., Disp: 30 tablet, Rfl: 11   ipratropium-albuterol  (DUONEB) 0.5-2.5 (3) MG/3ML SOLN, Take 3 mLs by nebulization every 6 (six) hours as needed., Disp: , Rfl:    levothyroxine  (SYNTHROID ) 150 MCG tablet, Take 150 mcg by mouth daily before breakfast., Disp: , Rfl:    lisinopril  (ZESTRIL ) 5 MG tablet, Take 1 tablet (5 mg total) by mouth daily., Disp: 90 tablet, Rfl: 1   metFORMIN (GLUCOPHAGE) 500 MG tablet, Take 500 mg by mouth 2 (two) times daily with a meal., Disp: , Rfl:    MOUNJARO 12.5 MG/0.5ML Pen, SMARTSIG:0.5 Milliliter(s) SUB-Q Once a Week, Disp: ,  Rfl:    Multiple Vitamin (MULTI-VITAMIN) tablet, Take 1 tablet by mouth daily., Disp: , Rfl:    potassium chloride  (MICRO-K ) 10 MEQ CR capsule, Take 10 mEq by mouth daily., Disp: , Rfl:    rivaroxaban  (XARELTO ) 20 MG TABS tablet, Take 20 mg by mouth daily with supper., Disp: , Rfl:    venlafaxine  XR (EFFEXOR -XR) 150 MG 24 hr capsule, Take 150 mg by mouth daily with breakfast., Disp: , Rfl:     buPROPion (WELLBUTRIN SR) 150 MG 12 hr tablet, Take by mouth., Disp: , Rfl:   Imaging Review  Hip Imaging: Hip-L CT wo contrast: Results for orders placed during the hospital encounter of 07/07/24 CT HIP LEFT WO CONTRAST  Narrative CLINICAL DATA:  Left hip pain and osteoarthritis  EXAM: CT OF THE LEFT HIP WITHOUT CONTRAST  TECHNIQUE: Multidetector CT imaging of the left hip was performed according to the standard protocol. Multiplanar CT image reconstructions were also generated.  RADIATION DOSE REDUCTION: This exam was performed according to the departmental dose-optimization program which includes automated exposure control, adjustment of the mA and/or kV according to patient size and/or use of iterative reconstruction technique.  COMPARISON:  04/18/2021  FINDINGS: Bones/Joint/Cartilage  Markedly severe/end-stage osteoarthritis of the left hip with full-thickness loss of articular cartilage, subcortical sclerosis, confluent degenerative subcortical cyst formation especially superiorly in both the acetabulum and femoral head; and volume loss and deformity in the femoral head which could be from partial resorption or remote healed fracture. Least 2 small fragmented osteophytes are present along the upper margin of the hip joint.  No acute fracture or acute bony findings. No significant joint effusion.  Ligaments  Suboptimally assessed by CT.  Muscles and Tendons  Unremarkable  Soft tissues  Persistent small fatty mass in the left perirectal space, possibly a focus of fat necrosis, not substantially changed from 04/18/2021. Highly likely to be benign/incidental.  Mild sigmoid colon diverticulosis. A calcified uterine fibroid is visible.  IMPRESSION: 1. Markedly severe/end-stage osteoarthritis of the left hip with full-thickness loss of articular cartilage, subcortical sclerosis, confluent degenerative subcortical cyst formation especially superiorly in both the  acetabulum and femoral head; and volume loss and deformity in the femoral head which could be from partial resorption or remote healed fracture. 2. Mild sigmoid colon diverticulosis. 3. Calcified uterine fibroid. 4. Persistent small fatty mass in the left perirectal space, possibly a focus of fat necrosis, not substantially changed from 04/18/2021. Highly likely to be benign/incidental.   Electronically Signed By: Ryan Salvage M.D. On: 07/07/2024 14:02  Complexity Note: Imaging results reviewed.                         ROS  Cardiovascular: Heart trouble, High blood pressure, and Heart attack ( Date:  ) Pulmonary or Respiratory: Lung problems, Shortness of breath, Smoking, Snoring , and Temporary stoppage of breathing during sleep Neurological: No reported neurological signs or symptoms such as seizures, abnormal skin sensations, urinary and/or fecal incontinence, being born with an abnormal open spine and/or a tethered spinal cord Psychological-Psychiatric: Anxiousness and Depressed Gastrointestinal: Reflux or heatburn Genitourinary: No reported renal or genitourinary signs or symptoms such as difficulty voiding or producing urine, peeing blood, non-functioning kidney, kidney stones, difficulty emptying the bladder, difficulty controlling the flow of urine, or chronic kidney disease Hematological: Weakness due to low blood hemoglobin or red blood cell count (Anemia) Endocrine: High blood sugar controlled without the use of insulin  (NIDDM) Rheumatologic: Rheumatoid arthritis Musculoskeletal: Negative for  myasthenia gravis, muscular dystrophy, multiple sclerosis or malignant hyperthermia Work History: Disabled  Allergies  Ms. Blea has no known allergies.  Laboratory Chemistry Profile   Renal Lab Results  Component Value Date   BUN 18 05/21/2021   CREATININE 0.93 05/21/2021   GFRAA >60 05/17/2020   GFRNONAA >60 05/21/2021   PROTEINUR NEGATIVE 04/18/2021      Electrolytes Lab Results  Component Value Date   NA 138 05/21/2021   K 4.4 05/21/2021   CL 102 05/21/2021   CALCIUM 10.2 05/21/2021   MG 2.4 05/13/2020   PHOS 4.3 05/12/2020     Hepatic Lab Results  Component Value Date   AST 29 05/21/2021   ALT 25 05/21/2021   ALBUMIN 4.0 05/21/2021   ALKPHOS 118 05/21/2021   LIPASE 20 04/19/2021     ID Lab Results  Component Value Date   HIV Non Reactive 05/11/2020   SARSCOV2NAA NEGATIVE 04/18/2021   MRSAPCR NEGATIVE 05/11/2020   HCVAB NON REACTIVE 04/19/2021     Bone No results found for: VD25OH, CI874NY7UNU, CI6874NY7, CI7874NY7, 25OHVITD1, 25OHVITD2, 25OHVITD3, TESTOFREE, TESTOSTERONE   Endocrine Lab Results  Component Value Date   GLUCOSE 119 (H) 05/21/2021   GLUCOSEU NEGATIVE 04/18/2021   HGBA1C 5.6 04/19/2021   TSH 3.343 05/11/2020   FREET4 0.91 05/11/2020     Neuropathy Lab Results  Component Value Date   HGBA1C 5.6 04/19/2021   HIV Non Reactive 05/11/2020     CNS No results found for: COLORCSF, APPEARCSF, RBCCOUNTCSF, WBCCSF, POLYSCSF, LYMPHSCSF, EOSCSF, PROTEINCSF, GLUCCSF, JCVIRUS, CSFOLI, IGGCSF, LABACHR, ACETBL   Inflammation (CRP: Acute  ESR: Chronic) Lab Results  Component Value Date   LATICACIDVEN 0.9 04/19/2021     Rheumatology No results found for: RF, ANA, LABURIC, URICUR, LYMEIGGIGMAB, LYMEABIGMQN, HLAB27   Coagulation Lab Results  Component Value Date   INR 1.2 04/19/2021   LABPROT 14.8 04/19/2021   APTT 39 (H) 04/19/2021   PLT 322 05/21/2021     Cardiovascular Lab Results  Component Value Date   BNP 59.6 04/19/2021   HGB 11.7 (L) 05/21/2021   HCT 37.3 05/21/2021     Screening Lab Results  Component Value Date   SARSCOV2NAA NEGATIVE 04/18/2021   MRSAPCR NEGATIVE 05/11/2020   HCVAB NON REACTIVE 04/19/2021   HIV Non Reactive 05/11/2020     Cancer No results found for: CEA, CA125, LABCA2   Allergens No results  found for: ALMOND, APPLE, ASPARAGUS, AVOCADO, BANANA, BARLEY, BASIL, BAYLEAF, GREENBEAN, LIMABEAN, WHITEBEAN, BEEFIGE, REDBEET, BLUEBERRY, BROCCOLI, CABBAGE, MELON, CARROT, CASEIN, CASHEWNUT, CAULIFLOWER, CELERY     Note: Lab results reviewed.  PFSH  Drug: Ms. Kimbler  reports current drug use. Drug: Marijuana. Alcohol:  reports current alcohol use of about 2.0 standard drinks of alcohol per week. Tobacco:  reports that she has been smoking cigarettes. She has never used smokeless tobacco. Medical:  has a past medical history of (HFpEF) heart failure with preserved ejection fraction (HCC), COPD (chronic obstructive pulmonary disease) (HCC), Depression, Hypertension, Hypothyroidism, Iron deficiency, Morbid obesity (HCC), OSA (obstructive sleep apnea), Osteoarthritis, PAF (paroxysmal atrial fibrillation) (HCC), Paroxysmal Atrial flutter (HCC), Pre-diabetes, and Vitamin B12 deficiency. Family: family history includes Depression in her sister; Heart disease in her mother; Hypertension in her mother; Obesity in her brother, sister, sister, and sister; Peripheral Artery Disease in her father.  Past Surgical History:  Procedure Laterality Date   BARIATRIC SURGERY  2002   CATARACT EXTRACTION W/PHACO Right 01/08/2022   Procedure: CATARACT EXTRACTION PHACO AND INTRAOCULAR LENS PLACEMENT (IOC) RIGHT DIABETIC;  Surgeon: Jaye Fallow, MD;  Location: [facility] ORS;  Service: Ophthalmology;  Laterality: Right;   Active Ambulatory Problems    Diagnosis Date Noted   Acute respiratory failure with hypoxia (HCC) 05/11/2020   Obstructive sleep apnea syndrome 02/10/2022   Atrial flutter (HCC)    Depression 02/10/2022   Hypothyroidism 10/24/2012   COPD (chronic obstructive pulmonary disease) (HCC) 02/10/2022   GERD (gastroesophageal reflux disease) 02/10/2022   Type 2 diabetes mellitus without complication, without long-term current use of insulin  (HCC) 09/06/2024    Acute on chronic diastolic heart failure (HCC)    Chronic diastolic CHF (congestive heart failure) (HCC) 04/19/2021   Paroxysmal atrial fibrillation (HCC) 04/19/2021   Severe tobacco use disorder 04/19/2021   Mass of pelvis 04/19/2021   Abdominal pain 04/19/2021   Choledocholithiasis 04/19/2021   Cholangitis (HCC) 04/19/2021   Abnormal LFTs 04/19/2021   Acute blood loss anemia 05/04/2021   Aneurysm of ascending aorta without rupture 04/28/2024   Anxiety disorder, unspecified 10/20/2019   Chronic headaches 10/24/2012   Combined forms of age-related cataract of both eyes 01/05/2017   Current mild episode of major depressive disorder without prior episode 02/10/2022   Dry eye 01/05/2017   GI bleed 05/04/2021   Hypertension 02/10/2022   Chronic knee pain (Bilateral) 10/24/2012   Body mass index (BMI) 70 or greater, adult (HCC) 10/24/2012   Prediabetes 10/20/2019   Recurrent major depressive disorder 02/15/2024   Senile nuclear cataract, left 02/10/2022   Chronic hip pain (Bilateral) 10/24/2012   Transaminitis 04/20/2021   Arthritis 10/24/2012   Cholelithiases 04/20/2021   Gout 09/11/2024   Type 2 diabetes mellitus treated without insulin  (HCC) 09/11/2024   Chronic anticoagulation (Xarelto ) 09/11/2024   Osteoarthritis involving multiple joints 09/11/2024   Severe, end-stage osteoarthritis of hips (Bilateral) 09/11/2024   Osteoarthritis of knees (Bilateral) 09/11/2024   Chronic pain syndrome 09/11/2024   Pharmacologic therapy 09/11/2024   Disorder of skeletal system 09/11/2024   Problems influencing health status 09/11/2024   Resolved Ambulatory Problems    Diagnosis Date Noted   Morbid obesity with BMI of 70 and over, adult (HCC) 02/10/2022   Past Medical History:  Diagnosis Date   (HFpEF) heart failure with preserved ejection fraction (HCC)    Iron deficiency    Morbid obesity (HCC)    OSA (obstructive sleep apnea)    Osteoarthritis    PAF (paroxysmal atrial fibrillation)  (HCC)    Pre-diabetes    Vitamin B12 deficiency    Constitutional Exam  General appearance: Well nourished, well developed, and well hydrated. In no apparent acute distress Vitals:   09/11/24 1258 09/11/24 1300  BP: (!) 133/109 122/81  Pulse: (!) 159 99  Resp: 20 18  SpO2: (!) 88% 99%  Weight: (!) 352 lb (159.7 kg)   Height: 5' 2 (1.575 m)    BMI Assessment: Estimated body mass index is 64.38 kg/m as calculated from the following:   Height as of this encounter: 5' 2 (1.575 m).   Weight as of this encounter: 352 lb (159.7 kg).  BMI interpretation table: BMI level Category Range association with higher incidence of chronic pain  <18 kg/m2 Underweight   18.5-24.9 kg/m2 Ideal body weight   25-29.9 kg/m2 Overweight Increased incidence by 20%  30-34.9 kg/m2 Obese (Class I) Increased incidence by 68%  35-39.9 kg/m2 Severe obesity (Class II) Increased incidence by 136%  >40 kg/m2 Extreme obesity (Class III) Increased incidence by 254%   Patient's current BMI Ideal Body weight  Body mass index is 64.38  kg/m. Ideal body weight: 50.1 kg (110 lb 7.2 oz) Adjusted ideal body weight: 93.9 kg (207 lb 1.1 oz)   BMI Readings from Last 4 Encounters:  09/11/24 64.38 kg/m  06/12/24 69.25 kg/m  01/08/22 74.33 kg/m  05/21/21 72.06 kg/m   Wt Readings from Last 4 Encounters:  09/11/24 (!) 352 lb (159.7 kg)  06/12/24 (!) 378 lb 9.6 oz (171.7 kg)  01/08/22 (!) 413 lb (187.3 kg)  05/21/21 (!) 394 lb (178.7 kg)    Psych/Mental status: Alert, oriented x 3 (person, place, & time)       Eyes: PERLA Respiratory: No evidence of acute respiratory distress Physical Exam   VITALS: P- 92 MEASUREMENTS: Weight- 378 pounds, BMI- 64.38. CARDIOVASCULAR: Heart rate is 92 beats per minute.       Assessment  Primary Diagnosis & Pertinent Problem List: The primary encounter diagnosis was Chronic pain syndrome. Diagnoses of Pharmacologic therapy, Disorder of skeletal system, Problems influencing  health status, Chronic hip pain (Bilateral), Severe, end-stage osteoarthritis of hips (Bilateral), Chronic knee pain (Bilateral), Osteoarthritis of knees (Bilateral), Osteoarthritis involving multiple joints, and Chronic anticoagulation (Xarelto ) were also pertinent to this visit.  Visit Diagnosis (New problems to examiner): 1. Chronic pain syndrome   2. Pharmacologic therapy   3. Disorder of skeletal system   4. Problems influencing health status   5. Chronic hip pain (Bilateral)   6. Severe, end-stage osteoarthritis of hips (Bilateral)   7. Chronic knee pain (Bilateral)   8. Osteoarthritis of knees (Bilateral)   9. Osteoarthritis involving multiple joints   10. Chronic anticoagulation (Xarelto )    Plan of Care (Initial workup plan)  Note: Ms. Villa was reminded that as per protocol, today's visit has been an evaluation only. We have not taken over the patient's controlled substance management.  Problem-specific plan: Assessment and Plan    End stage bilateral hip and knee osteoarthritis with chronic pain   Chronic bilateral hip and knee pain results from end stage osteoarthritis with bone-on-bone contact and femoral head collapse. Pain worsens with movement and weight-bearing. She is at high risk for surgical intervention due to morbid obesity and multiple comorbidities. Weight loss is necessary to qualify for surgery. Focus on weight loss to qualify for surgical intervention. Information on TENS unit for pain management was provided, and she is encouraged to use it on hips and knees.  Morbid obesity   With a BMI of 64.38, weight loss is critical for improving overall health and qualifying for osteoarthritis surgery. 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 Continue weight loss efforts with a goal to reduce BMI to less than 40. Participation in the 12-step program and lifestyle modifications, including diet and exercise,  are encouraged.  Pain in left shoulder   Chronic left shoulder pain has worsened over time, affecting daily activities such as lifting and getting out of a car. No current intervention is planned due to focus on weight loss and other comorbidities. Information on TENS unit for shoulder pain management was provided, and she is encouraged to use it on the shoulder.       Lab Orders  No laboratory test(s) ordered today   Imaging Orders  No imaging studies ordered today   Referral Orders  No referral(s) requested today   Procedure Orders    No procedure(s) ordered today   Pharmacotherapy (current): Medications ordered:  No orders of the defined types were placed in this encounter.  Medications administered during this visit: Alexandra  MICAEL Black had no medications administered during this visit.   Analgesic Pharmacotherapy:  Opioid Analgesics: For patients currently taking or requesting to take opioid analgesics, in accordance with [place]  Medical Board Guidelines, we will assess their risks and indications for the use of these substances. After completing our evaluation, we may offer recommendations, but we no longer take patients for medication management. The prescribing physician will ultimately decide, based on his/her training and level of comfort whether to adopt any of the recommendations, including whether or not to prescribe such medicines.  Membrane stabilizer: To be determined at a later time  Muscle relaxant: To be determined at a later time  NSAID: To be determined at a later time  Other analgesic(s): To be determined at a later time   Interventional management options: Ms. Lamarca was informed that there is no guarantee that she would be a candidate for interventional therapies. The decision will be based on the results of diagnostic studies, as well as Ms. Loden's risk profile.  Procedure(s) under consideration:  Pending results of ordered studies      Interventional Therapies  Risk Factors  Considerations  Medical Comorbidities:  XARELTO  Anticoagulation: (Stop: 3 days  Restart: 6 hours)  MO (BMI>60) (378 Lbs)  OSA  D-CHF  HTN  Ascending AA  A-Flutter  A-Fib  COPD  Smoker  GERD  Anxiety/Depression     Planned  Pending:   No interventional therapies until the patient brings her BMI down to 40 kg/m   Under consideration:   High risk for interventional therapies due to: MO (BMI>65) (378 Lbs) & XARELTO  Anticoagulation. Poor candidate until BMI<40.  High risk for opioid analgesics due to: OSA, COPD, Smoking, GERD, Obesity.   Completed: (Analgesic benefit)1  None at this time   Therapeutic  Palliative (PRN) options:   None established   Completed by other providers:   Diagnostic/therapeutic bilateral IA Steroid (Betamethasone) knee inj. x1 (06/23/2024) by Prentice Reges, [facility]/PMR)  1(Analgesic benefit): Expressed in percentage (%). (Local anesthetic[LA] +/- sedation  L.A.Local Anesthetic  Steroid benefit  Ongoing benefit)   Provider-requested follow-up: Return if symptoms worsen or fail to improve.  Future Appointments  Date Time Provider Department Center  09/13/2024  3:00 PM [facility]  2 [facility] None  09/18/2024  1:40 PM Agbor-Etang, Redell, MD [facility] None   I discussed the assessment and treatment plan with the patient. The patient was provided an opportunity to ask questions and all were answered. The patient agreed with the plan and demonstrated an understanding of the instructions.  Patient advised to call back or seek an in-person evaluation if the symptoms or condition worsens.  Duration of encounter: 72 minutes.  Total time on encounter, as per AMA guidelines included both the face-to-face and non-face-to-face time personally spent by the physician and/or other qualified health care professional(s) on the day of the encounter (includes time in activities that require the physician or  other qualified health care professional and does not include time in activities normally performed by clinical staff). Physician's time may include the following activities when performed: Preparing to see the patient (e.g., pre-charting review of records, searching for previously ordered imaging, lab work, and nerve conduction tests) Review of prior analgesic pharmacotherapies. Reviewing PMP Interpreting ordered tests (e.g., lab work, imaging, nerve conduction tests) Performing post-procedure evaluations, including interpretation of diagnostic procedures Obtaining and/or reviewing separately obtained history Performing a medically appropriate examination and/or evaluation Counseling and educating the patient/family/caregiver Ordering medications, tests, or procedures Referring and  communicating with other health care professionals (when not separately reported) Documenting clinical information in the electronic or other health record Independently interpreting results (not separately reported) and communicating results to the patient/ family/caregiver Care coordination (not separately reported)  Note by: Alexandra DELENA Como, MD (TTS and AI technology used. I apologize for any typographical errors that were not detected and corrected.) Date: 09/11/2024; Time: 2:15 PM

## 2024-09-06 DIAGNOSIS — E119 Type 2 diabetes mellitus without complications: Secondary | ICD-10-CM

## 2024-09-07 NOTE — Progress Notes (Signed)
 Follow-up   History of Present Illness: Alexandra Black is a 63 y.o. female who presents to clinic for recheck. Smoker, copd, osa on bipap, ascending thoracic aneurysm ( 4 cm), obesity, occasional etoh consumption. Her dyspnea is no worse since last seen.he has to pace. Walks with a walker. No chest pain, leg pan, trace edema present. No wheezing, no cough or gerd. Was on ozempic, on wegovy x 90 days so far.    Current Medications:  Current Outpatient Medications  Medication Sig Dispense Refill  . acetaminophen  (TYLENOL  ORAL) Take by mouth    . albuterol  MDI, PROVENTIL , VENTOLIN , PROAIR , HFA 90 mcg/actuation inhaler Inhale 2 inhalations into the lungs every 4 (four) hours as needed for Wheezing 1 each 2  . biotin  1,000 mcg Chew Take by mouth    . buPROPion (WELLBUTRIN SR) 150 MG SR tablet Take 1 tablet every morning for 7 days then 1 tablet twice a day for 87 days. 60 tablet 2  . cholecalciferol  (VITAMIN D3) 1000 unit tablet Take by mouth    . dilTIAZem  (CARDIZEM  CD) 120 MG XR capsule Take 120 mg by mouth once daily    . ELDERBERRY FRUIT ORAL Take by mouth    . fluticasone propion-salmeteroL (WIXELA INHUB) 100-50 mcg/dose diskus inhaler Inhale 1 Puff into the lungs every 12 (twelve) hours 60 each 11  . FUROsemide  (LASIX ) 40 MG tablet TAKE 1 TABLET BY MOUTH EVERY DAY 90 tablet 1  . ipratropium-albuteroL  (DUO-NEB) nebulizer solution Take 3 mLs by nebulization 4 (four) times daily as needed for Wheezing or Shortness of Breath 360 mL 1  . levothyroxine  (SYNTHROID ) 150 MCG tablet Take 1 tablet (150 mcg total) by mouth once daily 90 tablet 3  . lisinopriL  (ZESTRIL ) 5 MG tablet Take 5 mg by mouth once daily    . metFORMIN (GLUCOPHAGE) 500 MG tablet Take 1 tablet (500 mg total) by mouth 2 (two) times daily 200 tablet 2  . multivitamin capsule Take 1 capsule by mouth once daily    . peg-electrolyte (NULYTELY ) solution Use as directed. 4000 mL 0  . potassium chloride  (KLOR-CON ) 10 MEQ ER tablet  TAKE 1 TABLET BY MOUTH ONCE DAILY 90 tablet 3  . tirzepatide (MOUNJARO) 15 mg/0.5 mL pen injector Inject 0.5 mLs (15 mg total) subcutaneously once a week 6 mL 1  . varenicline tartrate (CHANTIX) 1 mg tablet Take 1 tablet (1 mg total) by mouth 2 (two) times daily 60 tablet 3  . venlafaxine  (EFFEXOR -XR) 150 MG XR capsule Take 1 capsule (150 mg total) by mouth once daily 100 capsule 3  . vitamin B complex (B COMPLEX-VITAMIN B12 ORAL) Take by mouth    . XARELTO  10 mg tablet TAKE 1 TABLET BY MOUTH DAILY WITH DINNER 100 tablet 3   No current facility-administered medications for this visit.    Problem List:  Patient Active Problem List  Diagnosis  . Arthritis  . Chronic headaches  . Combined forms of age-related cataract of both eyes  . Dry eye  . Hip pain, bilateral  . Hypothyroid  . Knee pain, bilateral  . Body mass index (BMI) 70 or greater, adult (CMS-HCC)  . Transaminitis  . Cholelithiases  . Severe tobacco use disorder - 20 cpd x 26 yrs  . COPD (chronic obstructive pulmonary disease) (CMS/HHS-HCC)  . Hypertension  . Anxiety disorder, unspecified  . Prediabetes  . Paroxysmal atrial fibrillation (CMS/HHS-HCC)  . Recurrent major depressive disorder ()  . OSA on CPAP  . Aneurysm of ascending aorta without  rupture ()  . Type 2 diabetes mellitus without complication, without long-term current use of insulin  (CMS/HHS-HCC)    History: Past Medical History:  Diagnosis Date  . Anxiety   . Atrial fib/flutter, transient (CMS/HHS-HCC)   . COPD (chronic obstructive pulmonary disease) (CMS/HHS-HCC)   . Hypertension   . Obesity     Past Surgical History:  Procedure Laterality Date  . ENDOSCOPIC RETROGRADE CHOLANGIO-PANCREATOGRAPHY W/SPHINCTEROTOMY N/A 04/24/2021   Procedure: ENDOSCOPIC RETROGRADE CHOLANGIOPANCREATOGRAPHY (ERCP); WITH SPHINCTEROTOMY/PAPILLOTOMY;  Surgeon: Elta Fonda Mt, MD;  Location: [name];  Service: Gastroenterology;  Laterality: N/A;  .  ESOPHAGOGASTRODOUDENOSCOPY W/ULTRASOUND EXAMINATION N/A 04/24/2021   Procedure: UPPER EUS; ESOPHAGOGASTRODUODENOSCOPY, FLEXIBLE, TRANSORAL; W/ ENDOSCOPIC ULTRASOUND EXAM, ESOPHAGUS, STOMACH, DUODENUM OR SURGICALLY ALTERED STOMACH WHERE JEJUNUM IS EXAMINED DISTAL TO ANASTOMOSIS;  Surgeon: Elta Fonda Mt, MD;  Location: [name];  Service: Gastroenterology;  Laterality: N/A;  . ENDOSCOPY OF BILIARY DUCT  04/24/2021   Procedure: ENDOSCOPIC CATHETERIZATION OF THE BILIARY DUCTAL SYSTEM, RADIOLOGICAL SUPERVISION AND INTERPRETATION;  Surgeon: Elta Fonda Mt, MD;  Location: [name];  Service: Gastroenterology;;  . ENDOSCOPIC RETROGRADE CHOLANGIO-PANCREATOGRAPHY W/EXTRACTION STONE  04/24/2021   Procedure: ENDOSCOPIC RETROGRADE CHOLANGIOPANCREATOGRAPHY (ERCP); WITH REMOVAL OF CALCULI/DEBRIS FROM BILIARY/PANCREATIC DUCT(S);  Surgeon: Elta Fonda Mt, MD;  Location: [name];  Service: Gastroenterology;;  . CESAREAN SECTION      Family History  Problem Relation Name Age of Onset  . Heart disease Mother      Social History   Socioeconomic History  . Marital status: Divorced  Tobacco Use  . Smoking status: Former    Current packs/day: 0.00    Average packs/day: 0.5 packs/day for 26.0 years (13.0 ttl pk-yrs)    Types: Cigarettes    Quit date: 03/13/2024    Years since quitting: 0.4  . Smokeless tobacco: Never  Vaping Use  . Vaping status: Never Used  Substance and Sexual Activity  . Alcohol use: Yes    Alcohol/week: 0.0 - 1.0 standard drinks of alcohol    Comment: rare  . Drug use: Yes    Comment: MJ for pain as needed  . Sexual activity: Not Currently   Social Drivers of Health   Financial Resource Strain: Medium Risk (08/11/2024)   Overall Financial Resource Strain (CARDIA)   . Difficulty of Paying Living Expenses: Somewhat hard  Food Insecurity: Food Insecurity Present (08/11/2024)   Hunger Vital Sign   . Worried About Programme Researcher, Broadcasting/film/video in the Last Year:  Sometimes true   . Ran Out of Food in the Last Year: Sometimes true  Transportation Needs: No Transportation Needs (08/11/2024)   PRAPARE - Transportation   . Lack of Transportation (Medical): No   . Lack of Transportation (Non-Medical): No    Allergies:  Patient has no known allergies.  Review of Systems: As per above. Pretty much unchanged . No associated cardiopulmonary, GI, GU, dermatological symptoms today. No focal neurological symptoms or psychological changes. Trace edema, no calf pain, no fever or chills.   Physical Exam: BP 138/84   Pulse 88   Ht 158.8 cm (5' 2.5)   Wt (!) 159.7 kg (352 lb)   SpO2 97% Comment: room air  BMI 63.36 kg/m  (!) 159.7 kg (352 lb) 97% General:  NAD. Able to speak in complete sentences without cough or dyspnea HEENT: Normocephalic, nontraumatic. Extraocular movements intact NECK: Supple. No JVD, nodes, thyromegaly CV: RRR no murmurs, gallops, rubs PULM: Normal respiratory effort,  without wheezing or crackles ABD: Increase girth EXTREMITIES: No  significant edema, cyanosis or Homans'signs SKIN: Fair turgor. No rashes LYMPHATIC: No nodes NEURO: No gross deficits, no change PSYCH: Appropriate affect, alert, oriented   Diagnostics:  1. Left ventricular ejection fraction, by estimation, is 55 to 60%. The left ventricle has normal function. The left ventricle has no regional wall motion abnormalities. There is mild left ventricular hypertrophy. Left ventricular diastolic parameters  are consistent with Grade I diastolic dysfunction (impaired relaxation).   2. Right ventricular systolic function is normal. The right ventricular size is normal. There is mildly elevated pulmonary artery systolic pressure.   3. The mitral valve is normal in structure. Trivial mitral valve regurgitation. No evidence of mitral stenosis.   4. The aortic valve is normal in structure. Aortic valve regurgitation is not visualized. No aortic stenosis is present.      FINDINGS:  Cardiovascular: Normal heart size. No significant pericardial  effusion/thickening. Dilated 4.0 cm ascending thoracic aorta. Normal  caliber pulmonary arteries.   Mediastinum/Nodes: No significant thyroid nodules. Unremarkable  esophagus. No pathologically enlarged axillary, mediastinal or hilar  lymph nodes, noting limited sensitivity for the detection of hilar  adenopathy on this noncontrast study.   Lungs/Pleura: No pneumothorax. No pleural effusion. Mild  centrilobular emphysema with diffuse bronchial wall thickening. No  acute consolidative airspace disease or lung masses. A few scattered  tiny solid pulmonary nodules up to 3.9 mm in the left upper lobe on  series 3/image 71.   Upper abdomen: Postsurgical changes partially visualized in the  proximal stomach.   Musculoskeletal: No aggressive appearing focal osseous lesions.  Marked thoracic spondylosis.   IMPRESSION:  1. Lung-RADS 2, benign appearance or behavior. Continue annual  screening with low-dose chest CT without contrast in 12 months.  2. Dilated 4.0 cm ascending thoracic aorta, which can be reassessed  on follow-up screening chest CT in 12 months.  3. Emphysema (ICD10-J43.9).   Electronically Signed    By: Selinda DELENA Blue M.D.    On: 04/18/2024 23:50   Impression: OSA, co2 retention, on trelegy machine, s/p gastic plasty, on wegovy. Has room to loose more weight -weight loss recommended ( losing weight- 37 lbs since last seen) -trelegy ( bipap for now)    Copd, smoker, thought how to take her inhalers -pro air 2 puffs qid prn, wixella 250/50 one puff bid -stay off cigarettes -lung cancer screening chest ct 6.26 -chantix   Peripheral edema,  some is dependant, less so today. -per above -Watch salt -elevate legs when sitting  Ascending thoracic aneurysm, 4 cm -repeat 6/26

## 2024-09-11 ENCOUNTER — Encounter: Payer: Self-pay | Admitting: Pain Medicine

## 2024-09-11 ENCOUNTER — Ambulatory Visit: Attending: Pain Medicine | Admitting: Pain Medicine

## 2024-09-11 VITALS — BP 122/81 | HR 99 | Resp 18 | Ht 62.0 in | Wt 352.0 lb

## 2024-09-11 DIAGNOSIS — Z7901 Long term (current) use of anticoagulants: Secondary | ICD-10-CM | POA: Diagnosis present

## 2024-09-11 DIAGNOSIS — M170 Bilateral primary osteoarthritis of knee: Secondary | ICD-10-CM | POA: Diagnosis present

## 2024-09-11 DIAGNOSIS — M25562 Pain in left knee: Secondary | ICD-10-CM | POA: Insufficient documentation

## 2024-09-11 DIAGNOSIS — Z789 Other specified health status: Secondary | ICD-10-CM | POA: Insufficient documentation

## 2024-09-11 DIAGNOSIS — M25551 Pain in right hip: Secondary | ICD-10-CM | POA: Diagnosis not present

## 2024-09-11 DIAGNOSIS — G8929 Other chronic pain: Secondary | ICD-10-CM | POA: Insufficient documentation

## 2024-09-11 DIAGNOSIS — M25552 Pain in left hip: Secondary | ICD-10-CM | POA: Insufficient documentation

## 2024-09-11 DIAGNOSIS — G894 Chronic pain syndrome: Secondary | ICD-10-CM | POA: Insufficient documentation

## 2024-09-11 DIAGNOSIS — M109 Gout, unspecified: Secondary | ICD-10-CM | POA: Insufficient documentation

## 2024-09-11 DIAGNOSIS — M150 Primary generalized (osteo)arthritis: Secondary | ICD-10-CM | POA: Insufficient documentation

## 2024-09-11 DIAGNOSIS — M160 Bilateral primary osteoarthritis of hip: Secondary | ICD-10-CM | POA: Insufficient documentation

## 2024-09-11 DIAGNOSIS — M899 Disorder of bone, unspecified: Secondary | ICD-10-CM | POA: Insufficient documentation

## 2024-09-11 DIAGNOSIS — M25561 Pain in right knee: Secondary | ICD-10-CM | POA: Insufficient documentation

## 2024-09-11 DIAGNOSIS — Z79899 Other long term (current) drug therapy: Secondary | ICD-10-CM | POA: Diagnosis present

## 2024-09-11 DIAGNOSIS — E119 Type 2 diabetes mellitus without complications: Secondary | ICD-10-CM | POA: Insufficient documentation

## 2024-09-11 NOTE — Patient Instructions (Signed)
 ______________________________________________________________________    TENS (Device can be purchased online, without prescription. Search: TENS 7000.) Transcutaneous electrical nerve stimulation (TENS) is a method of pain relief that involves the use of mild electrical stimulation. A TENS machine is a small, battery-operated device that has leads connected to sticky pads called electrodes. Available at [employer]. (Estimated price as of July 10th, 2025.)  (Estimated [employer] cost: $38.88) Rechargeable 9V batteries:  (Estimated [employer] cost: $12.98)  Larger Reusable 2 x 4 TENS Pads/Electrodes:  (Estimated [employer] cost: $9.99)  Total cost: $61.85    ELECTRODE PLACEMENT:   TENS UNIT SAFETY WARNING SHEET and INFORMATION INDICATIONS AND CONTRAINDICTIONS Read the operation manual before using the device. Freight forwarder ([place] ) restricts this device to sale by or on the order of a physician. Observe your physician's precise instructions and let him show you where to apply the electrodes. For a successful therapy, the correct application of the electrodes is an important factor. Carefully write down the settings your physician recommended. Indications for use This device is a prescription device and only for symptomatic relief of chronic intractable pain.  Contraindications:   Any electrode placement that applies current to the carotid sinus (neck) region.   Patients with implanted electronic devices (for example, a pacemaker) or metallic implants should not undertake.   Any electrode placement that causes current to flow transcerebrally (through the head). The use of unit whenever pain symptoms are undiagnosed, unit etiology is determined.   The use of TENS whenever pain syndromes are undiagnosed, until etiology is established.   WARNINGS AND PRECAUTIONS  Warnings:   The device must be kept out of reach of children.   The safety of device for use during pregnancy or delivery has not been established.    Do not place electrodes on front of the throat. This may result in spasms of the laryngeal and pharyngeal muscles.   Do not place the electrodes over the carotid nerve (side of neck below ear).   The device is not effective for pain of central origin (headaches).   The device may interfere with electronic monitoring equipment (such as ECG monitors and ECG alarms).   Electrodes should not be placed over the eyes, in the mouth, or internally.   These devices have no curative value.   TENS devices should be used only under the continued supervision of a physician.   TENS is a symptomatic treatment and as such suppresses the sensation of pain which would otherwise serve as a protective mechanism. Precautions/Adverse Reactions   Isolated cases of skin irritation may occur at the site of electrode placement following long-term application.   Stimulation should be stopped and electrodes removed until the cause of the irritation can be determined.   Effectiveness is highly dependent upon patient selection by a person qualified in the management of pain patients.   If the device treatment becomes ineffective or unpleasant, stimulation should be discontinued until reevaluation by a physician/clinician.   Always turn the device off before applying or removing electrodes.   Skin irritation and electrode burns are potential adverse reactions.  PURPOSE: A Transcutaneous Electrical Nerve Stimulator, or TENS, unit is designed to relieve post-operative, acute and chronic pain. It is used for pain caused by peripheral nerves and not central. TENS units are prescription-only devices.   OPERATION: TENS units work in a couple of ways. The first way they are thought to work is by a method called the [employer]. The [employer] states that our brains can only handle one  stimulus at a time. When you have chronic pain, this pain signal is constantly being sent to your brain and recognized as pain. When an electrical stimulus is added  to the area of pain the body feels this electrical stimulus, and since the brain can only handle one thing at a time, the pain is not transmitted to the brain. The second method thought to be part of TENS unit's success is by way of stimulating our own bodies to release their own natural painkillers. TENS units do not work for everyone and results may vary. Always follow the instructions and warnings in your user's manual.   USE: One of the most important tasks that must be performed is battery maintenance. If you are using a Engineering geologist, always fully charge it and fully deplete it before charging it again. These batteries can develop memories and by not performing this charging task correctly, your battery's life can be greatly diminished. If your battery does develop a memory you can help expand the memory by charging for 12 - 13 hours and then completely depleting the battery. Always prepare the skin before applying electrodes. Your skin should be clean and free of any lotions or creams. If you are using electrodes that use conductive gel, apply a small, even layer over the electrode. For carbon, self-adhesive electrodes, apply a drop of water to the electrodes before applying to the skin. The electrodes attach to the lead wires and then the TENS unit. Always grasp the connector and not the cord when inserting or removing. When making adjustments, always make sure the unit's channels (1 and 2) are in the OFF position. The actual settings should be recommended and prescribed by your physician. Medical equipment suppliers don'tset or instruct users as to user settings. When you are using the BURST mode, the unit delivers a series of quick pulses followed by a rest. This cycle repeats itself frequently. Always have channels OFF before changing modes.   For MODULATION mode, the stimulation automatically varies the width of the pulse.   For CONVENTIONAL mode, the stimulation is constant.  After the settings have been fine-tuned, set the timer to 30 or 60 minutes. Your physician should also prescribe the use time. When the lights become dim, it means your batteries should be replaced or recharged.   ACCESSORIES: The electrodes and lead wires can be obtained from your medical equipment supplier. Your medical equipment supplier can set up a recurring delivery to accommodate your needs. Electrodes should be replaced once a month and lead wires once every 6 months.  Video Tutorial [email or web address]://[email or web address].be/[email or web address]  ______________________________________________________________________

## 2024-09-13 ENCOUNTER — Ambulatory Visit: Attending: Cardiology

## 2024-09-13 DIAGNOSIS — I5033 Acute on chronic diastolic (congestive) heart failure: Secondary | ICD-10-CM | POA: Diagnosis not present

## 2024-09-13 LAB — ECHOCARDIOGRAM COMPLETE: Area-P 1/2: 3.77 cm2

## 2024-09-18 ENCOUNTER — Ambulatory Visit: Payer: Self-pay | Admitting: Cardiology

## 2024-09-18 ENCOUNTER — Ambulatory Visit: Admitting: Cardiology

## 2024-09-18 NOTE — OR Nursing (Signed)
 PER CHART REVIEW -Pt with multiple comorbidities including aneurysm ,OSA ,EF 55-60,STILL SMOKING, WEARS BIPAP AT HOME. BMI 64.38 . ACCORDING TO DR LOCKLEAR. PT WAS CLEARED FOR SURGERY BY DR DARIO. RN DID F/U WITH DR CARMINE  AND DR VICCI .FACE TO FACE CONCERNS WITH DR LOCKLEAR. SURGERY WILL PROCEED AT THIS TIME

## 2024-09-25 ENCOUNTER — Encounter: Payer: Self-pay | Admitting: Anesthesiology

## 2024-09-25 ENCOUNTER — Ambulatory Visit: Admission: RE | Admit: 2024-09-25

## 2024-09-25 ENCOUNTER — Encounter: Admission: RE | Payer: Self-pay

## 2024-09-25 SURGERY — COLONOSCOPY
Anesthesia: General

## 2024-10-09 ENCOUNTER — Ambulatory Visit

## 2024-10-09 ENCOUNTER — Ambulatory Visit
Admission: RE | Admit: 2024-10-09 | Discharge: 2024-10-09 | Disposition: A | Discharge status: Home / Self Care | Attending: Gastroenterology | Admitting: Gastroenterology

## 2024-10-09 ENCOUNTER — Encounter: Admission: RE | Payer: Self-pay | Source: Home / Self Care

## 2024-10-09 DIAGNOSIS — D125 Benign neoplasm of sigmoid colon: Secondary | ICD-10-CM | POA: Insufficient documentation

## 2024-10-09 DIAGNOSIS — K219 Gastro-esophageal reflux disease without esophagitis: Secondary | ICD-10-CM | POA: Diagnosis not present

## 2024-10-09 DIAGNOSIS — E119 Type 2 diabetes mellitus without complications: Secondary | ICD-10-CM | POA: Diagnosis not present

## 2024-10-09 DIAGNOSIS — R519 Headache, unspecified: Secondary | ICD-10-CM | POA: Insufficient documentation

## 2024-10-09 DIAGNOSIS — E039 Hypothyroidism, unspecified: Secondary | ICD-10-CM | POA: Diagnosis not present

## 2024-10-09 DIAGNOSIS — F419 Anxiety disorder, unspecified: Secondary | ICD-10-CM | POA: Diagnosis not present

## 2024-10-09 DIAGNOSIS — D649 Anemia, unspecified: Secondary | ICD-10-CM | POA: Diagnosis not present

## 2024-10-09 DIAGNOSIS — I5032 Chronic diastolic (congestive) heart failure: Secondary | ICD-10-CM | POA: Insufficient documentation

## 2024-10-09 DIAGNOSIS — Z7901 Long term (current) use of anticoagulants: Secondary | ICD-10-CM | POA: Diagnosis not present

## 2024-10-09 DIAGNOSIS — K5730 Diverticulosis of large intestine without perforation or abscess without bleeding: Secondary | ICD-10-CM | POA: Insufficient documentation

## 2024-10-09 DIAGNOSIS — K640 First degree hemorrhoids: Secondary | ICD-10-CM | POA: Diagnosis not present

## 2024-10-09 DIAGNOSIS — I509 Heart failure, unspecified: Secondary | ICD-10-CM | POA: Insufficient documentation

## 2024-10-09 DIAGNOSIS — I110 Hypertensive heart disease with heart failure: Secondary | ICD-10-CM | POA: Insufficient documentation

## 2024-10-09 DIAGNOSIS — F17200 Nicotine dependence, unspecified, uncomplicated: Secondary | ICD-10-CM | POA: Insufficient documentation

## 2024-10-09 DIAGNOSIS — Z1211 Encounter for screening for malignant neoplasm of colon: Secondary | ICD-10-CM | POA: Insufficient documentation

## 2024-10-09 DIAGNOSIS — J449 Chronic obstructive pulmonary disease, unspecified: Secondary | ICD-10-CM | POA: Diagnosis not present

## 2024-10-09 DIAGNOSIS — Z79899 Other long term (current) drug therapy: Secondary | ICD-10-CM | POA: Insufficient documentation

## 2024-10-09 DIAGNOSIS — Z800 Family history of malignant neoplasm of digestive organs: Secondary | ICD-10-CM | POA: Diagnosis not present

## 2024-10-09 DIAGNOSIS — G4733 Obstructive sleep apnea (adult) (pediatric): Secondary | ICD-10-CM | POA: Insufficient documentation

## 2024-10-09 DIAGNOSIS — E6689 Other obesity not elsewhere classified: Secondary | ICD-10-CM | POA: Insufficient documentation

## 2024-10-09 DIAGNOSIS — Z6841 Body Mass Index (BMI) 40.0 and over, adult: Secondary | ICD-10-CM | POA: Insufficient documentation

## 2024-10-09 DIAGNOSIS — I480 Paroxysmal atrial fibrillation: Secondary | ICD-10-CM | POA: Insufficient documentation

## 2024-10-09 DIAGNOSIS — Z79890 Hormone replacement therapy (postmenopausal): Secondary | ICD-10-CM | POA: Insufficient documentation

## 2024-10-09 DIAGNOSIS — Z7984 Long term (current) use of oral hypoglycemic drugs: Secondary | ICD-10-CM | POA: Insufficient documentation

## 2024-10-09 HISTORY — PX: COLONOSCOPY: SHX5424

## 2024-10-09 HISTORY — PX: POLYPECTOMY: SHX149

## 2024-10-09 SURGERY — COLONOSCOPY
Anesthesia: General

## 2024-10-09 MED ORDER — PROPOFOL 1000 MG/100ML IV EMUL
INTRAVENOUS | Status: AC
  Filled 2024-10-09: qty 100

## 2024-10-09 MED ORDER — LIDOCAINE HCL (CARDIAC) PF 100 MG/5ML IV SOSY
PREFILLED_SYRINGE | INTRAVENOUS | Status: DC | PRN
  Administered 2024-10-09: 100 mg via INTRAVENOUS

## 2024-10-09 MED ORDER — PROPOFOL 10 MG/ML IV BOLUS
INTRAVENOUS | Status: DC | PRN
  Administered 2024-10-09: 200 mg via INTRAVENOUS

## 2024-10-09 MED ORDER — EPHEDRINE 5 MG/ML INJ
INTRAVENOUS | Status: AC
  Filled 2024-10-09: qty 5

## 2024-10-09 MED ORDER — LIDOCAINE HCL (PF) 2 % IJ SOLN
INTRAMUSCULAR | Status: AC
  Filled 2024-10-09: qty 5

## 2024-10-09 MED ORDER — SODIUM CHLORIDE 0.9 % IV SOLN
INTRAVENOUS | Status: DC
  Administered 2024-10-09: 20 mL/h via INTRAVENOUS

## 2024-10-09 MED ORDER — SUCCINYLCHOLINE CHLORIDE 200 MG/10ML IV SOSY
PREFILLED_SYRINGE | INTRAVENOUS | Status: AC
  Filled 2024-10-09: qty 10

## 2024-10-09 MED ORDER — SUCCINYLCHOLINE 20MG/ML (10ML) SYRINGE FOR MEDFUSION PUMP - OPTIME
INTRAMUSCULAR | Status: DC | PRN
  Administered 2024-10-09: 200 mg via INTRAVENOUS

## 2024-10-09 NOTE — H&P (Signed)
 Outpatient short stay form Pre-procedure 10/09/2024  Ole ONEIDA Schick, MD  Primary Physician: [facility]  Reason for visit:  Screening  History of present illness:    63 y/o lady with history of morbid obesity, hypothyroidism, OSA, HFpEF, and a. Fib on xarelto  with last dose 4 days ago here for screening colonoscopy. Reportedly had normal colonoscopy over 10 years ago. History of stomach stapling and hysterectomy. Sister had esophageal cancer in her 44's.   Current Medications[1]  Medications Prior to Admission  Medication Sig Dispense Refill Last Dose/Taking   b complex vitamins tablet Take 1 tablet by mouth daily.   Past Week   Biotin  1000 MCG CHEW Chew by mouth.   Past Week   buPROPion (WELLBUTRIN SR) 150 MG 12 hr tablet Take by mouth.   Past Week   Cholecalciferol  25 MCG (1000 UT) capsule Take 1,000 Units by mouth daily.   Past Week   Cyanocobalamin  (B-12) 5000 MCG CAPS Take by mouth daily.   Past Week   diltiazem  (CARDIZEM  CD) 120 MG 24 hr capsule Take 1 capsule (120 mg total) by mouth daily. 90 capsule 1 Past Week   ELDERBERRY PO Take by mouth daily.   Past Week   ipratropium-albuterol  (DUONEB) 0.5-2.5 (3) MG/3ML SOLN Take 3 mLs by nebulization every 6 (six) hours as needed.   Past Week   levothyroxine  (SYNTHROID ) 150 MCG tablet Take 150 mcg by mouth daily before breakfast.   Past Week   lisinopril  (ZESTRIL ) 5 MG tablet Take 1 tablet (5 mg total) by mouth daily. 90 tablet 1 Past Week   metFORMIN (GLUCOPHAGE) 500 MG tablet Take 500 mg by mouth 2 (two) times daily with a meal.   Past Week   MOUNJARO 12.5 MG/0.5ML Pen SMARTSIG:0.5 Milliliter(s) SUB-Q Once a Week   Past Week   Multiple Vitamin (MULTI-VITAMIN) tablet Take 1 tablet by mouth daily.   Past Week   potassium chloride  (MICRO-K ) 10 MEQ CR capsule Take 10 mEq by mouth daily.   Past Week   rivaroxaban  (XARELTO ) 20 MG TABS tablet Take 20 mg by mouth daily with supper.   Past Week   venlafaxine  XR (EFFEXOR -XR)  150 MG 24 hr capsule Take 150 mg by mouth daily with breakfast.   Past Week   furosemide  (LASIX ) 40 MG tablet Take 1 tablet (40 mg total) by mouth daily. 30 tablet 11      Allergies[2]   Past Medical History:  Diagnosis Date   (HFpEF) heart failure with preserved ejection fraction (HCC)    a. 06/2019 Echo ([facility]): EF 55-60%; b. 04/2020 Echo: EF 55-60%, no rwma, Gr1 DD. Nl RV size/fxn. Mildly elev PASP. Triv MR.   COPD (chronic obstructive pulmonary disease) (HCC)    Depression    Hypertension    Hypothyroidism    Iron deficiency    Morbid obesity (HCC)    a. s/p bariatric surgery in 2002 (pt unsure of type of surgery).   OSA (obstructive sleep apnea)    Osteoarthritis    PAF (paroxysmal atrial fibrillation) (HCC)    a. 12/2019 [facility]: Avg HR 84 (32-214). Sinus rhythm w/ brief runs of Afib (2% burden - avg 112 bpm, 47-187). Pauses up to 3.5 secs.   Paroxysmal Atrial flutter (HCC)    Pre-diabetes    Vitamin B12 deficiency     Review of systems:  Otherwise negative.    Physical Exam  Gen: Alert, oriented. Appears stated age.  HEENT: PERRLA. Lungs: No respiratory distress CV: RRR Abd:  soft, benign, no masses Ext: No edema    Planned procedures: Proceed with colonoscopy. The patient understands the nature of the planned procedure, indications, risks, alternatives and potential complications including but not limited to bleeding, infection, perforation, damage to internal organs and possible oversedation/side effects from anesthesia. The patient agrees and gives consent to proceed.  Please refer to procedure notes for findings, recommendations and patient disposition/instructions.     Ole ONEIDA Schick, MD [facility]         [1]  Current Facility-Administered Medications:    0.9 %  sodium chloride  infusion, , Intravenous, Continuous, [place] Phifer, Ole ONEIDA, MD, Last Rate: 20 mL/hr at 10/09/24 1012, Continued from Pre-op at 10/09/24 1012 [2] No Known  Allergies

## 2024-10-09 NOTE — Interval H&P Note (Signed)
 History and Physical Interval Note:  10/09/2024 10:15 AM  Ellene LELON Elders  has presented today for surgery, with the diagnosis of Colon cancer screening (Z12.11).  The various methods of treatment have been discussed with the patient and family. After consideration of risks, benefits and other options for treatment, the patient has consented to  Procedures with comments: COLONOSCOPY (N/A) - PREFERS ABOUT 9 AM as a surgical intervention.  The patient's history has been reviewed, patient examined, no change in status, stable for surgery.  I have reviewed the patient's chart and labs.  Questions were answered to the patient's satisfaction.     Ole ONEIDA Schick  Ok to proceed with colonoscopy

## 2024-10-09 NOTE — Op Note (Signed)
 [facility] Patient Name: Alexandra Black Procedure Date: 10/09/2024 10:32 AM MRN: [id number] Account #: [id number] Date of Birth: 1961/07/28 Admit Type: Outpatient Age: 63 Room: [facility] ENDO ROOM 3 Gender: Female Note Status: Finalized Instrument Name: Colon Scope [id number] Procedure:             Colonoscopy Indications:           Screening for colorectal malignant neoplasm Providers:             Ole Schick MD, MD Referring MD:          No Local Md, MD (Referring MD) Medicines:             Monitored Anesthesia Care Complications:         No immediate complications. Estimated blood loss:                         Minimal. Procedure:             Pre-Anesthesia Assessment:                        - Prior to the procedure, a History and Physical was                         performed, and patient medications and allergies were                         reviewed. The patient is competent. The risks and                         benefits of the procedure and the sedation options and                         risks were discussed with the patient. All questions                         were answered and informed consent was obtained.                         Patient identification and proposed procedure were                         verified by the physician, the nurse, the                         anesthesiologist, the anesthetist and the technician                         in the endoscopy suite. Mental Status Examination:                         alert and oriented. Airway Examination: normal                         oropharyngeal airway and neck mobility. Respiratory                         Examination: clear to auscultation. CV Examination:  normal. Prophylactic Antibiotics: The patient does not                         require prophylactic antibiotics. Prior                         Anticoagulants: The patient has taken Xarelto                           (rivaroxaban ), last dose was 4 days prior to                         procedure. ASA Grade Assessment: IV - A patient with                         severe systemic disease that is a constant threat to                         life. After reviewing the risks and benefits, the                         patient was deemed in satisfactory condition to                         undergo the procedure. The anesthesia plan was to use                         monitored anesthesia care (MAC). Immediately prior to                         administration of medications, the patient was                         re-assessed for adequacy to receive sedatives. The                         heart rate, respiratory rate, oxygen saturations,                         blood pressure, adequacy of pulmonary ventilation, and                         response to care were monitored throughout the                         procedure. The physical status of the patient was                         re-assessed after the procedure.                        After obtaining informed consent, the colonoscope was                         passed under direct vision. Throughout the procedure,                         the patient's blood pressure, pulse, and oxygen  saturations were monitored continuously. The                         Colonoscope was introduced through the anus and                         advanced to the the cecum, identified by appendiceal                         orifice and ileocecal valve. The colonoscopy was                         technically difficult and complex due to the patient's                         body habitus. The patient tolerated the procedure                         well. The quality of the bowel preparation was good                         except the cecum was fair. The ileocecal valve,                         appendiceal orifice, and rectum were photographed. Findings:       The perianal and digital rectal examinations were normal.      A 4 mm polyp was found in the sigmoid colon. The polyp was sessile. The       polyp was removed with a cold snare. Resection and retrieval were       complete. Estimated blood loss was minimal.      A few small-mouthed diverticula were found in the sigmoid colon.      Internal hemorrhoids were found during retroflexion. The hemorrhoids       were Grade I (internal hemorrhoids that do not prolapse).      The exam was otherwise without abnormality on direct and retroflexion       views. Impression:            - One 4 mm polyp in the sigmoid colon, removed with a                         cold snare. Resected and retrieved.                        - Diverticulosis in the sigmoid colon.                        - Internal hemorrhoids.                        - The examination was otherwise normal on direct and                         retroflexion views. Recommendation:        - Discharge patient to home.                        - Resume previous diet.                        -  Resume Xarelto  (rivaroxaban ) at prior dose today.                        - Await pathology results.                        - Repeat colonoscopy in 3 years because the bowel                         preparation was suboptimal.                        - Return to referring physician as previously                         scheduled. Procedure Code(s):     --- Professional ---                        [phone], Colonoscopy, flexible; with removal of                         tumor(s), polyp(s), or other lesion(s) by snare                         technique Diagnosis Code(s):     --- Professional ---                        Z12.11, Encounter for screening for malignant neoplasm                         of colon                        D12.5, Benign neoplasm of sigmoid colon                        K64.0, First degree hemorrhoids                        K57.30, Diverticulosis of  large intestine without                         perforation or abscess without bleeding CPT copyright 2022 [employer]. All rights reserved. The codes documented in this report are preliminary and upon coder review may  be revised to meet current compliance requirements. Ole Schick MD, MD 10/09/2024 11:10:46 AM Number of Addenda: 0 Note Initiated On: 10/09/2024 10:32 AM Scope Withdrawal Time: 0 hours 9 minutes 22 seconds  Total Procedure Duration: 0 hours 15 minutes 37 seconds  Estimated Blood Loss:  Estimated blood loss was minimal.      [facility]

## 2024-10-09 NOTE — Anesthesia Postprocedure Evaluation (Signed)
"   Anesthesia Post Note  Patient: Alexandra Black  Procedure(s) Performed: COLONOSCOPY POLYPECTOMY, INTESTINE  Anesthesia Type: General Anesthetic complications: no   There were no known notable events for this encounter.   Last Vitals:  Vitals:   10/09/24 0946 10/09/24 1115  BP: (!) 111/93 105/71  Pulse: (!) 101 92  Resp: 20 (!) 22  Temp: (!) 35.8 C (!) 35.8 C  SpO2: 99% 98%    Last Pain:  Vitals:   10/09/24 1115  TempSrc: Temporal  PainSc: 0-No pain                 Karna BIRCH [name]      "

## 2024-10-09 NOTE — Anesthesia Procedure Notes (Signed)
 Procedure Name: Intubation Date/Time: 10/09/2024 10:44 AM  Performed by: Brieanne Mignone D, CRNAPre-anesthesia Checklist: Patient identified, Emergency Drugs available, Suction available, Patient being monitored and Timeout performed Patient Re-evaluated:Patient Re-evaluated prior to induction Oxygen Delivery Method: Circle system utilized Preoxygenation: Pre-oxygenation with 100% oxygen Induction Type: IV induction Ventilation: Mask ventilation without difficulty Laryngoscope Size: McGrath and 3 Grade View: Grade I Tube type: Oral Tube size: 7.0 mm Number of attempts: 1 Airway Equipment and Method: Patient positioned with wedge pillow Placement Confirmation: ETT inserted through vocal cords under direct vision and positive ETCO2 Secured at: 23 cm Tube secured with: Tape Dental Injury: Teeth and Oropharynx as per pre-operative assessment

## 2024-10-09 NOTE — Anesthesia Preprocedure Evaluation (Signed)
"                                    Anesthesia Evaluation  Patient identified by MRN, date of birth, ID band Patient awake    Reviewed: Allergy & Precautions, NPO status , Patient's Chart, lab work & pertinent test results  Airway Mallampati: III  TM Distance: >3 FB     Dental  (+) Teeth Intact   Pulmonary sleep apnea , COPD,  COPD inhaler, Current Smoker and Patient abstained from smoking.    + decreased breath sounds      Cardiovascular Exercise Tolerance: Good hypertension, Pt. on medications +CHF   Rhythm:Regular Rate:Normal     Neuro/Psych  Headaches  Anxiety        GI/Hepatic Neg liver ROS,GERD  Medicated,,  Endo/Other  diabetes, Well Controlled, Type 2Hypothyroidism  Class 4 obesity  Renal/GU negative Renal ROS  negative genitourinary   Musculoskeletal   Abdominal  (+) + obese  Peds negative pediatric ROS (+)  Hematology  (+) Blood dyscrasia, anemia   Anesthesia Other Findings   Reproductive/Obstetrics                              Anesthesia Physical Anesthesia Plan  ASA: 4  Anesthesia Plan: General   Post-op Pain Management:    Induction: Intravenous  PONV Risk Score and Plan:   Airway Management Planned: Oral ETT  Additional Equipment:   Intra-op Plan:   Post-operative Plan: Extubation in OR  Informed Consent: I have reviewed the patients History and Physical, chart, labs and discussed the procedure including the risks, benefits and alternatives for the proposed anesthesia with the patient or authorized representative who has indicated his/her understanding and acceptance.     Dental Advisory Given  Plan Discussed with: CRNA  Anesthesia Plan Comments: (Patient consented for risks of anesthesia including but not limited to:  - adverse reactions to medications - damage to eyes, teeth, lips or other oral mucosa - nerve damage due to positioning  - sore throat or hoarseness - Damage to heart,  brain, nerves, lungs, other parts of body or loss of life  Patient voiced understanding and assent.)        Anesthesia Quick Evaluation  "

## 2024-10-09 NOTE — Transfer of Care (Signed)
 Immediate Anesthesia Transfer of Care Note  Patient: Alexandra Black  Procedure(s) Performed: COLONOSCOPY POLYPECTOMY, INTESTINE  Patient Location: PACU  Anesthesia Type:General  Level of Consciousness: awake  Airway & Oxygen Therapy: Patient Spontanous Breathing  Post-op Assessment: Report given to RN  Post vital signs: stable  Last Vitals:  Vitals Value Taken Time  BP 105/71 10/09/24 11:15  Temp    Pulse 92 10/09/24 11:16  Resp 19 10/09/24 11:16  SpO2 98 % 10/09/24 11:16  Vitals shown include unfiled device data.  Last Pain:  Vitals:   10/09/24 0953  TempSrc:   PainSc: 7          Complications: No notable events documented.

## 2024-10-10 LAB — SURGICAL PATHOLOGY

## 2024-10-10 NOTE — OR Nursing (Signed)
 Pt called 1600 stating to be in pain all over; pain greater than her usual pains, pain even with speaking; denies fever/chills; 10/09/24 colonoscopy with intubation (see anest/procedure notes); stated throat mildly sore, but her real issue was with entire body pains, stated to have taken x4 tylenol  q4hrs with no relief, requesting something for pain; encouraged to call [facility], PCP and or to come to ED for eval. She declined eval ED r/t personal hygiene. she stated was going to call PCP
